# Patient Record
Sex: Female | Born: 1935 | Race: Black or African American | Hispanic: No | Marital: Single | Smoking: Current every day smoker
Health system: Southern US, Community
[De-identification: ages and names within clinical notes are randomized; demographics above are authoritative.]

## PROBLEM LIST (undated history)

## (undated) DIAGNOSIS — I10 Essential (primary) hypertension: Secondary | ICD-10-CM

## (undated) DIAGNOSIS — Z720 Tobacco use: Secondary | ICD-10-CM

## (undated) DIAGNOSIS — D696 Thrombocytopenia, unspecified: Secondary | ICD-10-CM

## (undated) DIAGNOSIS — E039 Hypothyroidism, unspecified: Secondary | ICD-10-CM

## (undated) DIAGNOSIS — A419 Sepsis, unspecified organism: Secondary | ICD-10-CM

---

## 2008-07-26 ENCOUNTER — Emergency Department: Payer: Self-pay | Admitting: Internal Medicine

## 2010-09-05 ENCOUNTER — Inpatient Hospital Stay: Payer: Self-pay | Admitting: Internal Medicine

## 2010-10-02 ENCOUNTER — Ambulatory Visit: Payer: Self-pay | Admitting: Internal Medicine

## 2011-10-09 ENCOUNTER — Other Ambulatory Visit: Payer: Self-pay

## 2011-10-09 ENCOUNTER — Emergency Department (HOSPITAL_COMMUNITY)
Admission: EM | Admit: 2011-10-09 | Discharge: 2011-10-09 | Disposition: A | Discharge status: Home / Self Care | Payer: Medicare Other | Attending: Emergency Medicine | Admitting: Emergency Medicine

## 2011-10-09 ENCOUNTER — Emergency Department (HOSPITAL_COMMUNITY): Payer: Medicare Other

## 2011-10-09 DIAGNOSIS — R29810 Facial weakness: Secondary | ICD-10-CM | POA: Insufficient documentation

## 2011-10-09 DIAGNOSIS — R0602 Shortness of breath: Secondary | ICD-10-CM | POA: Insufficient documentation

## 2011-10-09 DIAGNOSIS — R29898 Other symptoms and signs involving the musculoskeletal system: Secondary | ICD-10-CM | POA: Insufficient documentation

## 2011-10-09 DIAGNOSIS — J189 Pneumonia, unspecified organism: Secondary | ICD-10-CM | POA: Insufficient documentation

## 2011-10-09 DIAGNOSIS — Z79899 Other long term (current) drug therapy: Secondary | ICD-10-CM | POA: Insufficient documentation

## 2011-10-09 LAB — CBC
MCH: 31.2 pg (ref 26.0–34.0)
MCHC: 33.8 g/dL (ref 30.0–36.0)
MCV: 92.4 fL (ref 78.0–100.0)
Platelets: 104 10*3/uL — ABNORMAL LOW (ref 150–400)
RBC: 3.94 MIL/uL (ref 3.87–5.11)
RDW: 13.5 % (ref 11.5–15.5)

## 2011-10-09 LAB — URINALYSIS, ROUTINE W REFLEX MICROSCOPIC
Glucose, UA: NEGATIVE mg/dL
Ketones, ur: NEGATIVE mg/dL
Leukocytes, UA: NEGATIVE
Protein, ur: 30 mg/dL — AB
Urobilinogen, UA: 0.2 mg/dL (ref 0.0–1.0)

## 2011-10-09 LAB — COMPREHENSIVE METABOLIC PANEL
ALT: 11 U/L (ref 0–35)
AST: 22 U/L (ref 0–37)
Albumin: 2.8 g/dL — ABNORMAL LOW (ref 3.5–5.2)
Alkaline Phosphatase: 65 U/L (ref 39–117)
CO2: 27 mEq/L (ref 19–32)
Chloride: 95 mEq/L — ABNORMAL LOW (ref 96–112)
GFR calc non Af Amer: 87 mL/min — ABNORMAL LOW (ref >90)
Potassium: 3.5 mEq/L (ref 3.5–5.1)
Sodium: 131 mEq/L — ABNORMAL LOW (ref 135–145)
Total Bilirubin: 0.2 mg/dL — ABNORMAL LOW (ref 0.3–1.2)

## 2011-10-09 LAB — DIFFERENTIAL
Basophils Relative: 0 % (ref 0–1)
Eosinophils Absolute: 0 10*3/uL (ref 0.0–0.7)
Eosinophils Relative: 0 % (ref 0–5)
Lymphs Abs: 0.8 10*3/uL (ref 0.7–4.0)

## 2011-10-09 LAB — URINE MICROSCOPIC-ADD ON

## 2011-10-09 MED ORDER — AZITHROMYCIN 250 MG PO TABS: 250.0000 mg | ORAL_TABLET | Freq: Every day | ORAL | Status: AC

## 2011-10-09 MED ORDER — AZITHROMYCIN 250 MG PO TABS
500.0000 mg | ORAL_TABLET | Freq: Once | ORAL | Status: AC
  Administered 2011-10-09: 500 mg via ORAL
  Filled 2011-10-09: qty 2

## 2011-10-09 NOTE — ED Notes (Signed)
Patient states she has been feeling weak x 2 days and went to her doctors today. Doctor sent patient to ED for evaluation of a possible stroke. Patient states that her mouth drooping on one side due to not having her lower plate in her mouth. Patient denies N/V/F. Patietn states generalized weakness in her legs. Patient denies chest pain and SOB. Patient placed on monitor and 2L oxygen with sats of 93%. EKG captured on arrival.

## 2011-10-09 NOTE — ED Notes (Signed)
Patient resting and waiting test results. Family at bedside.

## 2011-10-09 NOTE — ED Notes (Signed)
Pt arrives via EMS from MD office where she was being seen for slight facial droop and slight left side weakness X2 days.  Pt able to ambulate as normal.  Per EMS EKG normal sinus  138/87   124 CBG, 10%nasal cannula 2 liters lung sounds clear.  No IV access, NKDA, history of MI, asthma.  No history of stroke.  Pt denies pain, N/V

## 2011-10-09 NOTE — ED Notes (Addendum)
Pt ambulated by Idalia Needle, EMT  without difficulty with cane. Pt has steady gait without any assistance needed.

## 2011-10-09 NOTE — ED Provider Notes (Signed)
History     CSN: [id number]  Arrival date & time 10/09/11  1400   First MD Initiated Contact with Patient 10/09/11 1411      Chief Complaint  Patient presents with  . Weakness    (Consider location/radiation/quality/duration/timing/severity/associated sxs/prior treatment) HPI Hx obtained from pt. Patient presents with 2 days of weakness. She states that she feels weak in her legs bilaterally. She is able to walk, but has been using a cane to do so. She states that she does not always need to use a cane. She has not noticed any weakness in her upper extremities or numbness or tingling. Denies trouble speaking. Denies changes in vision. Has not had any chest pain, shortness of breath, diaphoresis. Denies abdominal pain, nausea, vomiting, diarrhea. Denies cough or URI symptoms. She went to her PCPs office today and was noted to have some left facial drooping and was told to come to the ED for further evaluation. The patient states that she believes this is because she is not currently wearing her lower set of dentures.  No past medical history on file.  No past surgical history on file.  No family history on file.  History  Substance Use Topics  . Smoking status: Not on file  . Smokeless tobacco: Not on file  . Alcohol Use: Not on file    OB History    No data available      Review of Systems  Constitutional: Negative for fever, chills, activity change and appetite change.  HENT: Negative for congestion, sore throat, rhinorrhea, trouble swallowing and neck pain.   Eyes: Negative for photophobia and visual disturbance.  Respiratory: Negative for chest tightness and shortness of breath.   Cardiovascular: Negative for chest pain and palpitations.  Gastrointestinal: Negative for nausea, vomiting, abdominal pain and diarrhea.  Genitourinary: Negative for dysuria.  Musculoskeletal: Negative for myalgias.  Skin: Negative for color change and rash.  Neurological: Positive for  weakness. Negative for dizziness, syncope, numbness and headaches.    Allergies  Review of patient's allergies indicates no known allergies.  Home Medications   Current Outpatient Rx  Name Route Sig Dispense Refill  . GUAIFENESIN-CODEINE 100-10 MG/5ML PO SYRP Oral Take 10 mLs by mouth every 8 (eight) hours as needed. For cough    . LOSARTAN POTASSIUM-HCTZ 100-25 MG PO TABS Oral Take 1 tablet by mouth daily.    Marland Kitchen METOPROLOL SUCCINATE ER 25 MG PO TB24 Oral Take 25 mg by mouth daily.    Marland Kitchen POTASSIUM CHLORIDE ER 10 MEQ PO TBCR Oral Take 10 mEq by mouth 2 (two) times daily.    Marland Kitchen SIMVASTATIN 80 MG PO TABS Oral Take 80 mg by mouth at bedtime.      BP 109/80  Pulse 96  Temp(Src) 99.6 F (37.6 C) (Oral)  Resp 16  SpO2 93%  Physical Exam  Nursing note and vitals reviewed. Constitutional: She is oriented to person, place, and time. She appears well-developed and well-nourished. No distress.  HENT:  Head: Normocephalic and atraumatic.  Right Ear: External ear normal.  Left Ear: External ear normal.  Mouth/Throat: Oropharynx is clear and moist. No oropharyngeal exudate.  Eyes: Conjunctivae and EOM are normal. Pupils are equal, round, and reactive to light.  Neck: Normal range of motion. Neck supple.  Cardiovascular: Normal rate, regular rhythm and normal heart sounds.   Pulmonary/Chest: Effort normal and breath sounds normal. She has no wheezes. She exhibits no tenderness.  Abdominal: Soft. Bowel sounds are normal. There is no tenderness.  There is no rebound and no guarding.  Musculoskeletal: Normal range of motion. She exhibits no edema.  Neurological: She is alert and oriented to person, place, and time. She has normal reflexes. She displays normal reflexes. No cranial nerve deficit or sensory deficit. She exhibits normal muscle tone. Coordination normal.       5/5 strength to resistance on pedal/dorsiflex, knee flex/ext, elbow flex/ext. Grip strength equal b/l. Moves all 4 ext equally,  sensory intact to lt touch in all 4 ext. Coordination as assessed with finger to nose intact. No obvious facial droop noted.  Skin: Skin is warm and dry. No rash noted. She is not diaphoretic.  Psychiatric: She has a normal mood and affect.    ED Course  Procedures (including critical care time)   Date: 10/09/2011  Rate: 82  Rhythm: normal sinus rhythm and sinus arrhythmia  QRS Axis: left  Intervals: normal  ST/T Wave abnormalities: nonspecific T wave abnormality - flipped Ts in V4 and V3  Conduction Disutrbances:none  Narrative Interpretation:   Old EKG Reviewed: none available    Labs Reviewed  COMPREHENSIVE METABOLIC PANEL - Abnormal; Notable for the following:    Sodium 131 (*)    Chloride 95 (*)    Total Protein 8.5 (*)    Albumin 2.8 (*)    Total Bilirubin 0.2 (*)    GFR calc non Af Amer 87 (*)    All other components within normal limits  CBC - Abnormal; Notable for the following:    Platelets 104 (*) PLATELET COUNT CONFIRMED BY SMEAR   All other components within normal limits  DIFFERENTIAL - Abnormal; Notable for the following:    Neutrophils Relative 84 (*)    Neutro Abs 7.8 (*)    Lymphocytes Relative 8 (*)    All other components within normal limits  URINALYSIS, ROUTINE W REFLEX MICROSCOPIC - Abnormal; Notable for the following:    Protein, ur 30 (*)    All other components within normal limits  POCT I-STAT TROPONIN I  URINE MICROSCOPIC-ADD ON   Dg Chest 2 View  10/09/2011  *RADIOLOGY REPORT*  Clinical Data: Weakness, shortness of breatha  CHEST - 2 VIEW  Comparison: None.  Findings: There is patchy airspace opacity in the posterior left lower lobe and infrahilar region.  Right lung clear.  No effusion. Heart size upper limits normal.  Atheromatous aortic arch. Regional bones unremarkable.  IMPRESSION:  1.  Patchy posterior left lower lobe airspace disease suggesting pneumonia. Recommend follow-up to confirm appropriate resolution.  Original Report  Authenticated By: Osa Craver, M.D.   Ct Head Wo Contrast  10/09/2011  *RADIOLOGY REPORT*  Clinical Data: Weakness.  CT HEAD WITHOUT CONTRAST  Technique:  Contiguous axial images were obtained from the base of the skull through the vertex without contrast.  Comparison: No priors.  Findings: Mild cerebral and cerebellar atrophy with ex vacuo dilatation of the ventricular system.  There are patchy and confluent areas of decreased attenuation throughout the deep and periventricular white matter of the cerebral hemispheres bilaterally, most consistent with chronic microvascular ischemic changes.  No definite acute intracranial abnormalities. Specifically, no definite signs to suggest acute/subacute cerebral ischemia (assessment is very difficult upon this background of chronic ischemic changes, however), no focal mass, mass effect, hydrocephalus or abnormal intra or extra-axial fluid collections. No lacunar infarction in the left globus pallidus.  No displaced skull fractures are identified.  Visualized paranasal sinuses and mastoids are well pneumatized.  A small osteoma in the  right posterior ethmoids.  IMPRESSION: 1.  There are extensive chronic microvascular ischemic changes in the deep and periventricular white matter of the cerebral hemispheres bilaterally which makes accurate assessment for subtle areas of acute and subacute ischemia exceedingly difficult.  Within the limitations of this examination, there are no definite acute findings in the brain. 2.  Mild cerebral and cerebellar atrophy. 3.  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 4.  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  Original Report Authenticated By: Florencia Reasons, M.D.     1. Community acquired pneumonia       MDM  Pt with 2 days of weakness in her bilateral legs with walking. No cp, sob. No neuro deficits on exam. Labs/imaging which I personally reviewed significant for infiltrate suspicious for  early PNA on CXR. Otherwise remarkable for mild hyponatremia. UA without evidence for infx. Plan to give pt first dose abx, ambulate. Provided she can ambulate, will dc home with abx rx. Signout given to Dr. Golda Acre who will dispo as appropriate.       Grant Fontana, [place] 10/10/11 [phone]

## 2011-10-09 NOTE — ED Provider Notes (Signed)
Patient relates she started feeling weak 2 days ago. She states her left leg feels weak. She's having some difficulty walking. She denies any weakness in her hand. She denies cough, shortness of breath or fever.  Patient is alert and cooperative she's in no respiratory distress, I do not see any coughing. No obvious motor weakness noted  Medical screening examination/treatment/procedure(s) were conducted as a shared visit with non-physician practitioner(s) and myself.  I personally evaluated the patient during the encounter Devoria Albe, MD, Franz Dell, MD 10/09/11 1537

## 2011-10-09 NOTE — ED Provider Notes (Signed)
Patient turned over by the PA Grant Fontana for followup on the patient's ability to ambulate.  Patient did ambulate here with oxygen saturations of 95% and has received her initial dose of antibiotics.  Patient did have a possible infiltrate on her x-ray concerning for pneumonia which would explain this patient's generalized weakness.  Patient discharged with a prescription for azithromycin and followup with her primary care physician  Nat Christen, MD 10/09/11 [phone]

## 2011-10-09 NOTE — Discharge Instructions (Signed)

## 2011-10-09 NOTE — ED Notes (Signed)
Patient resting with NAD at this time. Family at bedside.  

## 2011-10-09 NOTE — ED Notes (Signed)
Patient transported to X-ray 

## 2011-10-14 NOTE — ED Provider Notes (Signed)
See prior note   Ward Givens, MD 10/14/11 1504

## 2012-05-22 ENCOUNTER — Encounter (HOSPITAL_COMMUNITY): Payer: Self-pay | Admitting: Emergency Medicine

## 2012-05-22 ENCOUNTER — Emergency Department (HOSPITAL_COMMUNITY)
Admission: EM | Admit: 2012-05-22 | Discharge: 2012-05-22 | Disposition: A | Discharge status: Home / Self Care | Payer: PRIVATE HEALTH INSURANCE | Attending: Emergency Medicine | Admitting: Emergency Medicine

## 2012-05-22 DIAGNOSIS — T7840XA Allergy, unspecified, initial encounter: Secondary | ICD-10-CM

## 2012-05-22 DIAGNOSIS — Z79899 Other long term (current) drug therapy: Secondary | ICD-10-CM | POA: Insufficient documentation

## 2012-05-22 DIAGNOSIS — L299 Pruritus, unspecified: Secondary | ICD-10-CM | POA: Insufficient documentation

## 2012-05-22 DIAGNOSIS — T4595XA Adverse effect of unspecified primarily systemic and hematological agent, initial encounter: Secondary | ICD-10-CM | POA: Insufficient documentation

## 2012-05-22 DIAGNOSIS — F17200 Nicotine dependence, unspecified, uncomplicated: Secondary | ICD-10-CM | POA: Insufficient documentation

## 2012-05-22 MED ORDER — PREDNISONE 20 MG PO TABS
60.0000 mg | ORAL_TABLET | Freq: Once | ORAL | Status: AC
  Administered 2012-05-22: 60 mg via ORAL
  Filled 2012-05-22: qty 3

## 2012-05-22 MED ORDER — DIPHENHYDRAMINE HCL 25 MG PO TABS: 25.0000 mg | ORAL_TABLET | Freq: Four times a day (QID) | ORAL | Status: DC | PRN

## 2012-05-22 MED ORDER — FAMOTIDINE 20 MG PO TABS: 20.0000 mg | ORAL_TABLET | Freq: Two times a day (BID) | ORAL | Status: DC

## 2012-05-22 MED ORDER — DIPHENHYDRAMINE HCL 25 MG PO CAPS
25.0000 mg | ORAL_CAPSULE | Freq: Once | ORAL | Status: AC
  Administered 2012-05-22: 25 mg via ORAL
  Filled 2012-05-22: qty 1

## 2012-05-22 MED ORDER — PREDNISONE 20 MG PO TABS: 40.0000 mg | ORAL_TABLET | Freq: Every day | ORAL | Status: DC

## 2012-05-22 NOTE — ED Notes (Signed)
Pt presenting to ed with c/o rash to bilateral arms and bilateral legs x 1 week. Pt states positive itching. Pt denies pain at this time

## 2012-05-22 NOTE — ED Provider Notes (Signed)
History     CSN: [id number]  Arrival date & time 05/22/12  1609   First MD Initiated Contact with Patient 05/22/12 1727      Chief Complaint  Patient presents with  . Rash    (Consider location/radiation/quality/duration/timing/severity/associated sxs/prior treatment) HPI Comments: Patient reports pruritic rash on all of her extremities that has been present for 1 week.  Denies fevers, itching or swelling in the mouth or throat, difficulty swallowing or breathing.  Denies any pain with the rash.  No family members or close contacts with similar rash.  Granddaughter dose note that patient has been taking dollar store brand cough medication this week.  No other medication changes.  No other exposures or changes to personal care products or detergents.   The history is provided by the patient and a relative.    History reviewed. No pertinent past medical history.  History reviewed. No pertinent past surgical history.  No family history on file.  History  Substance Use Topics  . Smoking status: Current Every Day Smoker -- 1.0 packs/day  . Smokeless tobacco: Not on file  . Alcohol Use: No    OB History    Grav Para Term Preterm Abortions TAB SAB Ect Mult Living                  Review of Systems  Constitutional: Negative for fever and chills.  HENT: Negative for sore throat and trouble swallowing.   Respiratory: Negative for shortness of breath.   Cardiovascular: Negative for chest pain.  Skin: Positive for rash.    Allergies  Review of patient's allergies indicates no known allergies.  Home Medications   Current Outpatient Rx  Name Route Sig Dispense Refill  . GUAIFENESIN-CODEINE 100-10 MG/5ML PO SYRP Oral Take 10 mLs by mouth every 8 (eight) hours as needed. For cough    . HYDROCORTISONE 0.5 % EX CREA Topical Apply 1 application topically 2 (two) times daily.    Marland Kitchen LOSARTAN POTASSIUM-HCTZ 100-25 MG PO TABS Oral Take 1 tablet by mouth daily.    Marland Kitchen METOPROLOL  SUCCINATE ER 25 MG PO TB24 Oral Take 25 mg by mouth every other day.     Marland Kitchen SIMVASTATIN 80 MG PO TABS Oral Take 80 mg by mouth at bedtime.      BP 111/74  Pulse 109  Temp 98.8 F (37.1 C) (Oral)  Resp 20  SpO2 98%  Physical Exam  Nursing note and vitals reviewed. Constitutional: She appears well-developed and well-nourished. No distress.  HENT:  Head: Normocephalic and atraumatic.  Neck: Neck supple.  Cardiovascular: Normal rate, regular rhythm and normal heart sounds.   Pulmonary/Chest: Effort normal and breath sounds normal. No respiratory distress. She has no wheezes. She has no rales.  Neurological: She is alert.  Skin: Rash noted. She is not diaphoretic.       Erythematous continuous rash along all extremities.  Spares trunk, face feet, and hands.     ED Course  Procedures (including critical care time)  Labs Reviewed - No data to display No results found.  6:17 PM Pt also seen and examined by Dr Freida Busman.    1. Allergic reaction     MDM  Pt with diffuse pruritic rash on extremities only, sparing hands and feet.  No fevers, no pain.  No airway concerns.  Pt has been using OTC cough medication from dollar store - likely source of reaction.  Pt advised never to use this again.  Pt given prednisone, benadryl in  ED, D/C home with same + pepcid.  Discussed diagnosis and plan with patient and family member.  Pt given return precautions.  Pt verbalizes understanding and agrees with plan.          [place], [place] 05/22/12 1901

## 2012-05-22 NOTE — ED Provider Notes (Signed)
Medical screening examination/treatment/procedure(s) were performed by non-physician practitioner and as supervising physician I was immediately available for consultation/collaboration.  Toy Baker, MD 05/22/12 [phone]

## 2012-05-22 NOTE — ED Provider Notes (Signed)
Medical screening examination/treatment/procedure(s) were conducted as a shared visit with non-physician practitioner(s) and myself.  I personally evaluated the patient during the encounter  Pt here with allergic reaction from meds, will tx with meds  Toy Baker, MD 05/22/12 1814

## 2014-08-01 DIAGNOSIS — H2512 Age-related nuclear cataract, left eye: Secondary | ICD-10-CM | POA: Diagnosis not present

## 2014-08-01 DIAGNOSIS — H5440 Blindness, one eye, unspecified eye: Secondary | ICD-10-CM | POA: Diagnosis not present

## 2014-08-01 DIAGNOSIS — H538 Other visual disturbances: Secondary | ICD-10-CM | POA: Diagnosis not present

## 2014-08-11 DIAGNOSIS — E785 Hyperlipidemia, unspecified: Secondary | ICD-10-CM | POA: Diagnosis not present

## 2014-08-11 DIAGNOSIS — Z01118 Encounter for examination of ears and hearing with other abnormal findings: Secondary | ICD-10-CM | POA: Diagnosis not present

## 2014-08-11 DIAGNOSIS — R7309 Other abnormal glucose: Secondary | ICD-10-CM | POA: Diagnosis not present

## 2014-08-11 DIAGNOSIS — Z0101 Encounter for examination of eyes and vision with abnormal findings: Secondary | ICD-10-CM | POA: Diagnosis not present

## 2014-08-11 DIAGNOSIS — Z0000 Encounter for general adult medical examination without abnormal findings: Secondary | ICD-10-CM | POA: Diagnosis not present

## 2014-08-11 DIAGNOSIS — Z1389 Encounter for screening for other disorder: Secondary | ICD-10-CM | POA: Diagnosis not present

## 2014-08-15 DIAGNOSIS — B309 Viral conjunctivitis, unspecified: Secondary | ICD-10-CM | POA: Diagnosis not present

## 2014-08-15 DIAGNOSIS — H2512 Age-related nuclear cataract, left eye: Secondary | ICD-10-CM | POA: Diagnosis not present

## 2014-08-15 DIAGNOSIS — H5440 Blindness, one eye, unspecified eye: Secondary | ICD-10-CM | POA: Diagnosis not present

## 2014-08-15 DIAGNOSIS — H538 Other visual disturbances: Secondary | ICD-10-CM | POA: Diagnosis not present

## 2014-09-13 DIAGNOSIS — H25812 Combined forms of age-related cataract, left eye: Secondary | ICD-10-CM | POA: Diagnosis not present

## 2014-09-13 DIAGNOSIS — H40031 Anatomical narrow angle, right eye: Secondary | ICD-10-CM | POA: Diagnosis not present

## 2014-09-13 DIAGNOSIS — H2521 Age-related cataract, morgagnian type, right eye: Secondary | ICD-10-CM | POA: Diagnosis not present

## 2014-09-13 DIAGNOSIS — H20011 Primary iridocyclitis, right eye: Secondary | ICD-10-CM | POA: Diagnosis not present

## 2014-09-19 DIAGNOSIS — H25811 Combined forms of age-related cataract, right eye: Secondary | ICD-10-CM | POA: Diagnosis not present

## 2014-09-19 DIAGNOSIS — H2511 Age-related nuclear cataract, right eye: Secondary | ICD-10-CM | POA: Diagnosis not present

## 2014-09-24 ENCOUNTER — Encounter (HOSPITAL_COMMUNITY): Payer: Self-pay | Admitting: *Deleted

## 2014-09-24 ENCOUNTER — Emergency Department (HOSPITAL_COMMUNITY)
Admission: EM | Admit: 2014-09-24 | Discharge: 2014-09-24 | Disposition: A | Discharge status: Home / Self Care | Payer: Medicare Other | Attending: Emergency Medicine | Admitting: Emergency Medicine

## 2014-09-24 DIAGNOSIS — Z720 Tobacco use: Secondary | ICD-10-CM | POA: Diagnosis not present

## 2014-09-24 DIAGNOSIS — R29898 Other symptoms and signs involving the musculoskeletal system: Secondary | ICD-10-CM

## 2014-09-24 DIAGNOSIS — R000 Tachycardia, unspecified: Secondary | ICD-10-CM | POA: Diagnosis not present

## 2014-09-24 DIAGNOSIS — I10 Essential (primary) hypertension: Secondary | ICD-10-CM | POA: Insufficient documentation

## 2014-09-24 DIAGNOSIS — R531 Weakness: Secondary | ICD-10-CM | POA: Diagnosis present

## 2014-09-24 DIAGNOSIS — M6281 Muscle weakness (generalized): Secondary | ICD-10-CM | POA: Diagnosis not present

## 2014-09-24 HISTORY — DX: Essential (primary) hypertension: I10

## 2014-09-24 LAB — I-STAT TROPONIN, ED: TROPONIN I, POC: 0.02 ng/mL (ref 0.00–0.08)

## 2014-09-24 LAB — BASIC METABOLIC PANEL
ANION GAP: 6 (ref 5–15)
BUN: 13 mg/dL (ref 6–23)
CALCIUM: 9.2 mg/dL (ref 8.4–10.5)
CHLORIDE: 107 mmol/L (ref 96–112)
CO2: 22 mmol/L (ref 19–32)
Creatinine, Ser: 0.96 mg/dL (ref 0.50–1.10)
GFR calc non Af Amer: 55 mL/min — ABNORMAL LOW (ref >90)
GFR, EST AFRICAN AMERICAN: 64 mL/min — AB (ref >90)
GLUCOSE: 112 mg/dL — AB (ref 70–99)
POTASSIUM: 3.5 mmol/L (ref 3.5–5.1)
Sodium: 135 mmol/L (ref 135–145)

## 2014-09-24 LAB — CBC
HEMATOCRIT: 41.7 % (ref 36.0–46.0)
HEMOGLOBIN: 13.6 g/dL (ref 12.0–15.0)
MCH: 30.5 pg (ref 26.0–34.0)
MCHC: 32.6 g/dL (ref 30.0–36.0)
MCV: 93.5 fL (ref 78.0–100.0)
Platelets: 151 10*3/uL (ref 150–400)
RBC: 4.46 MIL/uL (ref 3.87–5.11)
RDW: 14.1 % (ref 11.5–15.5)
WBC: 7.6 10*3/uL (ref 4.0–10.5)

## 2014-09-24 NOTE — ED Notes (Signed)
Pt reports fatigue and weakness since she got up this am. "feels like her legs are giving out on her." grips are equal and no arm drift noted. Family denies speech problems or confusion.

## 2014-09-24 NOTE — ED Provider Notes (Addendum)
CSN: [id number]     Arrival date & time 09/24/14  1239 History   First MD Initiated Contact with Patient 09/24/14 1458     Chief Complaint  Patient presents with  . Weakness     (Consider location/radiation/quality/duration/timing/severity/associated sxs/prior Treatment) HPI  81 year old female presents with weakness and trouble getting off the toilet that happened around noon today. The son states that while she was cutting off the toilet she was unable to get the strength to get off. This was from weakness in both legs. There was no arm weakness. Patient has mild memory problems but otherwise has not been confused and is not different than her normal mental status. Patient has not had any headaches, vomiting, or diarrhea. She denies chest pain and shortness of breath. Currently she feels her strength in her legs is better she has no trouble moving her legs. Denies any back pain or back injury. No urinary symptoms including no urinary or bowel incontinence. She currently lives at home with her sister.  Past Medical History  Diagnosis Date  . Hypertension    History reviewed. No pertinent past surgical history. History reviewed. No pertinent family history. History  Substance Use Topics  . Smoking status: Current Every Day Smoker    Types: Cigarettes  . Smokeless tobacco: Not on file  . Alcohol Use: No   OB History    No data available     Review of Systems  Constitutional: Negative for fever.  Respiratory: Negative for cough and shortness of breath.   Cardiovascular: Negative for chest pain.  Gastrointestinal: Negative for nausea, vomiting, abdominal pain and diarrhea.  Genitourinary: Negative for dysuria.  Neurological: Positive for weakness. Negative for dizziness, light-headedness, numbness and headaches.  All other systems reviewed and are negative.     Allergies  Review of patient's allergies indicates no known allergies.  Home Medications   Prior to Admission  medications   Not on File   BP 121/76 mmHg  Pulse 80  Temp(Src) 98.2 F (36.8 C)  Resp 16  SpO2 100% Physical Exam  Constitutional: She is oriented to person, place, and time. She appears well-developed and well-nourished.  HENT:  Head: Normocephalic and atraumatic.  Right Ear: External ear normal.  Left Ear: External ear normal.  Nose: Nose normal.  Eyes: EOM are normal. Pupils are equal, round, and reactive to light. Right eye exhibits no discharge. Left eye exhibits no discharge.  Right eye covered by clear shield  Cardiovascular: Normal rate, regular rhythm and normal heart sounds.   Pulmonary/Chest: Effort normal and breath sounds normal.  Abdominal: Soft. She exhibits no distension. There is no tenderness.  Musculoskeletal:       Cervical back: She exhibits no tenderness.       Thoracic back: She exhibits no tenderness.       Lumbar back: She exhibits no tenderness.  Neurological: She is alert and oriented to person, place, and time. She has normal reflexes.  CN 2-12 grossly intact. 5/5 strength in all 4 extremities. Normal sensation. Able to get up and walk. Walks with a short, shuffling gait, normal per son.  Skin: Skin is warm and dry.  Nursing note and vitals reviewed.   ED Course  Procedures (including critical care time) Labs Review Labs Reviewed  BASIC METABOLIC PANEL - Abnormal; Notable for the following:    Glucose, Bld 112 (*)    GFR calc non Af Amer 55 (*)    GFR calc Af Amer 64 (*)  All other components within normal limits  CBC  I-STAT TROPOININ, ED    Imaging Review No results found.   EKG Interpretation   Date/Time:  Saturday September 24 2014 13:12:43 EST Ventricular Rate:  102 PR Interval:  148 QRS Duration: 72 QT Interval:  348 QTC Calculation: 453 R Axis:   90 Text Interpretation:  Sinus tachycardia with occasional Premature  ventricular complexes Rightward axis Low voltage QRS Borderline ECG  nonspecific T waves No old tracing to  compare Confirmed by Doss Cybulski  MD,  Trianna Lupien ([id number]) on 09/24/2014 3:00:49 PM      MDM   Final diagnoses:  Weakness of both legs    Here patient has normal neurologic exam including being able to get up and walk with minimal to no axis distance. This is normal for her. It seems that she had changing weakness try to get off of the toilet. Difficult to explain what this is from given that she has normal proximal strength in her quadriceps on my exam. Normal sensation and reflexes. No back symptoms to suggest a spinal emergency. Given she has no symptoms now feel she is stable for discharge. I did discuss with the case management who has evaluated patient and will arrange for home health and physical therapy to evaluate patient at home. She has been laying around a lot and not been active per her son, so conditioning is likely a factor in her weakness. Given bilateral symptoms this is unlikely a TIA or CVA    Audree CamelScott T Saurabh Hettich, MD 09/24/14 1616  Audree CamelScott T Chayna Surratt, MD 09/24/14 [phone]

## 2014-09-24 NOTE — Progress Notes (Signed)
CSW met with patient and her son at their request to receive information about assisted livings.  CSW discussed the options of SNF, ALF, and home health and gave resources.  Patient and son stated they would like to try receiving a home health/ PT assessment to see if she could remain in home and, "get stronger."  After given the options the family seemed visibly less anxious and asked if a referral could be made to an agency that would take her agency.  Patient's son asked that the agency contact him, Taylor Bass at [phone].  [facility] Richardo Priest ED CSW [phone]

## 2014-09-24 NOTE — Progress Notes (Signed)
09/24/2014 3:48 PM Taylor Bass, Justice DeedsJUDITH SPARKS, RN,BSN        [facility] son [facility]. Orders noted for home health OT and PT to eval and treat. [facility]. [facility] [phone]. No further needs identified at this time.

## 2014-09-26 DIAGNOSIS — R531 Weakness: Secondary | ICD-10-CM | POA: Diagnosis not present

## 2014-09-26 DIAGNOSIS — I10 Essential (primary) hypertension: Secondary | ICD-10-CM | POA: Diagnosis not present

## 2014-09-26 DIAGNOSIS — F17210 Nicotine dependence, cigarettes, uncomplicated: Secondary | ICD-10-CM | POA: Diagnosis not present

## 2014-09-27 DIAGNOSIS — H2512 Age-related nuclear cataract, left eye: Secondary | ICD-10-CM | POA: Diagnosis not present

## 2014-09-28 DIAGNOSIS — I10 Essential (primary) hypertension: Secondary | ICD-10-CM | POA: Diagnosis not present

## 2014-09-28 DIAGNOSIS — F17210 Nicotine dependence, cigarettes, uncomplicated: Secondary | ICD-10-CM | POA: Diagnosis not present

## 2014-09-28 DIAGNOSIS — R531 Weakness: Secondary | ICD-10-CM | POA: Diagnosis not present

## 2014-10-03 DIAGNOSIS — I10 Essential (primary) hypertension: Secondary | ICD-10-CM | POA: Diagnosis not present

## 2014-10-03 DIAGNOSIS — H21562 Pupillary abnormality, left eye: Secondary | ICD-10-CM | POA: Diagnosis not present

## 2014-10-03 DIAGNOSIS — H2512 Age-related nuclear cataract, left eye: Secondary | ICD-10-CM | POA: Diagnosis not present

## 2014-10-03 DIAGNOSIS — H268 Other specified cataract: Secondary | ICD-10-CM | POA: Diagnosis not present

## 2014-10-03 DIAGNOSIS — R531 Weakness: Secondary | ICD-10-CM | POA: Diagnosis not present

## 2014-10-03 DIAGNOSIS — F17210 Nicotine dependence, cigarettes, uncomplicated: Secondary | ICD-10-CM | POA: Diagnosis not present

## 2014-10-05 DIAGNOSIS — I10 Essential (primary) hypertension: Secondary | ICD-10-CM | POA: Diagnosis not present

## 2014-10-05 DIAGNOSIS — F17210 Nicotine dependence, cigarettes, uncomplicated: Secondary | ICD-10-CM | POA: Diagnosis not present

## 2014-10-05 DIAGNOSIS — R531 Weakness: Secondary | ICD-10-CM | POA: Diagnosis not present

## 2014-10-11 DIAGNOSIS — R531 Weakness: Secondary | ICD-10-CM | POA: Diagnosis not present

## 2014-10-11 DIAGNOSIS — I10 Essential (primary) hypertension: Secondary | ICD-10-CM | POA: Diagnosis not present

## 2014-10-11 DIAGNOSIS — F17210 Nicotine dependence, cigarettes, uncomplicated: Secondary | ICD-10-CM | POA: Diagnosis not present

## 2014-10-12 DIAGNOSIS — E876 Hypokalemia: Secondary | ICD-10-CM | POA: Diagnosis not present

## 2014-10-12 DIAGNOSIS — E559 Vitamin D deficiency, unspecified: Secondary | ICD-10-CM | POA: Diagnosis not present

## 2014-10-12 DIAGNOSIS — E039 Hypothyroidism, unspecified: Secondary | ICD-10-CM | POA: Diagnosis not present

## 2014-10-12 DIAGNOSIS — I10 Essential (primary) hypertension: Secondary | ICD-10-CM | POA: Diagnosis not present

## 2014-10-12 DIAGNOSIS — R7309 Other abnormal glucose: Secondary | ICD-10-CM | POA: Diagnosis not present

## 2014-10-12 DIAGNOSIS — J45909 Unspecified asthma, uncomplicated: Secondary | ICD-10-CM | POA: Diagnosis not present

## 2014-10-12 DIAGNOSIS — E785 Hyperlipidemia, unspecified: Secondary | ICD-10-CM | POA: Diagnosis not present

## 2014-10-13 DIAGNOSIS — F17210 Nicotine dependence, cigarettes, uncomplicated: Secondary | ICD-10-CM | POA: Diagnosis not present

## 2014-10-13 DIAGNOSIS — R531 Weakness: Secondary | ICD-10-CM | POA: Diagnosis not present

## 2014-10-13 DIAGNOSIS — I10 Essential (primary) hypertension: Secondary | ICD-10-CM | POA: Diagnosis not present

## 2014-10-18 DIAGNOSIS — F17210 Nicotine dependence, cigarettes, uncomplicated: Secondary | ICD-10-CM | POA: Diagnosis not present

## 2014-10-18 DIAGNOSIS — I10 Essential (primary) hypertension: Secondary | ICD-10-CM | POA: Diagnosis not present

## 2014-10-18 DIAGNOSIS — R531 Weakness: Secondary | ICD-10-CM | POA: Diagnosis not present

## 2014-10-20 DIAGNOSIS — R531 Weakness: Secondary | ICD-10-CM | POA: Diagnosis not present

## 2014-10-20 DIAGNOSIS — F17210 Nicotine dependence, cigarettes, uncomplicated: Secondary | ICD-10-CM | POA: Diagnosis not present

## 2014-10-20 DIAGNOSIS — I10 Essential (primary) hypertension: Secondary | ICD-10-CM | POA: Diagnosis not present

## 2014-10-25 DIAGNOSIS — I10 Essential (primary) hypertension: Secondary | ICD-10-CM | POA: Diagnosis not present

## 2014-10-25 DIAGNOSIS — R531 Weakness: Secondary | ICD-10-CM | POA: Diagnosis not present

## 2014-10-25 DIAGNOSIS — F17210 Nicotine dependence, cigarettes, uncomplicated: Secondary | ICD-10-CM | POA: Diagnosis not present

## 2014-10-27 DIAGNOSIS — I739 Peripheral vascular disease, unspecified: Secondary | ICD-10-CM | POA: Diagnosis not present

## 2014-10-27 DIAGNOSIS — E559 Vitamin D deficiency, unspecified: Secondary | ICD-10-CM | POA: Diagnosis not present

## 2014-10-27 DIAGNOSIS — R7309 Other abnormal glucose: Secondary | ICD-10-CM | POA: Diagnosis not present

## 2014-11-07 ENCOUNTER — Encounter (HOSPITAL_COMMUNITY): Payer: Self-pay | Admitting: Emergency Medicine

## 2014-12-08 DIAGNOSIS — E785 Hyperlipidemia, unspecified: Secondary | ICD-10-CM | POA: Diagnosis not present

## 2014-12-08 DIAGNOSIS — I739 Peripheral vascular disease, unspecified: Secondary | ICD-10-CM | POA: Diagnosis not present

## 2014-12-08 DIAGNOSIS — I10 Essential (primary) hypertension: Secondary | ICD-10-CM | POA: Diagnosis not present

## 2014-12-08 DIAGNOSIS — E559 Vitamin D deficiency, unspecified: Secondary | ICD-10-CM | POA: Diagnosis not present

## 2014-12-08 DIAGNOSIS — R7309 Other abnormal glucose: Secondary | ICD-10-CM | POA: Diagnosis not present

## 2015-01-24 DIAGNOSIS — H4720 Unspecified optic atrophy: Secondary | ICD-10-CM | POA: Diagnosis not present

## 2015-01-24 DIAGNOSIS — H538 Other visual disturbances: Secondary | ICD-10-CM | POA: Diagnosis not present

## 2015-01-24 DIAGNOSIS — Z961 Presence of intraocular lens: Secondary | ICD-10-CM | POA: Diagnosis not present

## 2015-02-07 DIAGNOSIS — H524 Presbyopia: Secondary | ICD-10-CM | POA: Diagnosis not present

## 2015-02-07 DIAGNOSIS — Z961 Presence of intraocular lens: Secondary | ICD-10-CM | POA: Diagnosis not present

## 2015-04-04 DIAGNOSIS — E785 Hyperlipidemia, unspecified: Secondary | ICD-10-CM | POA: Diagnosis not present

## 2015-04-04 DIAGNOSIS — R7309 Other abnormal glucose: Secondary | ICD-10-CM | POA: Diagnosis not present

## 2015-04-04 DIAGNOSIS — E559 Vitamin D deficiency, unspecified: Secondary | ICD-10-CM | POA: Diagnosis not present

## 2015-04-04 DIAGNOSIS — I739 Peripheral vascular disease, unspecified: Secondary | ICD-10-CM | POA: Diagnosis not present

## 2015-04-04 DIAGNOSIS — I10 Essential (primary) hypertension: Secondary | ICD-10-CM | POA: Diagnosis not present

## 2015-08-17 DIAGNOSIS — Z01118 Encounter for examination of ears and hearing with other abnormal findings: Secondary | ICD-10-CM | POA: Diagnosis not present

## 2015-08-17 DIAGNOSIS — Z1389 Encounter for screening for other disorder: Secondary | ICD-10-CM | POA: Diagnosis not present

## 2015-08-17 DIAGNOSIS — I739 Peripheral vascular disease, unspecified: Secondary | ICD-10-CM | POA: Diagnosis not present

## 2015-08-17 DIAGNOSIS — Z131 Encounter for screening for diabetes mellitus: Secondary | ICD-10-CM | POA: Diagnosis not present

## 2015-08-17 DIAGNOSIS — J45909 Unspecified asthma, uncomplicated: Secondary | ICD-10-CM | POA: Diagnosis not present

## 2015-08-17 DIAGNOSIS — Z136 Encounter for screening for cardiovascular disorders: Secondary | ICD-10-CM | POA: Diagnosis not present

## 2015-08-17 DIAGNOSIS — Z0100 Encounter for examination of eyes and vision without abnormal findings: Secondary | ICD-10-CM | POA: Diagnosis not present

## 2015-08-17 DIAGNOSIS — Z0000 Encounter for general adult medical examination without abnormal findings: Secondary | ICD-10-CM | POA: Diagnosis not present

## 2015-10-26 DIAGNOSIS — Q809 Congenital ichthyosis, unspecified: Secondary | ICD-10-CM | POA: Diagnosis not present

## 2015-10-26 DIAGNOSIS — I10 Essential (primary) hypertension: Secondary | ICD-10-CM | POA: Diagnosis not present

## 2015-10-26 DIAGNOSIS — J45909 Unspecified asthma, uncomplicated: Secondary | ICD-10-CM | POA: Diagnosis not present

## 2015-10-26 DIAGNOSIS — I739 Peripheral vascular disease, unspecified: Secondary | ICD-10-CM | POA: Diagnosis not present

## 2015-10-26 DIAGNOSIS — R739 Hyperglycemia, unspecified: Secondary | ICD-10-CM | POA: Diagnosis not present

## 2015-10-26 DIAGNOSIS — E559 Vitamin D deficiency, unspecified: Secondary | ICD-10-CM | POA: Diagnosis not present

## 2015-10-26 DIAGNOSIS — G4700 Insomnia, unspecified: Secondary | ICD-10-CM | POA: Diagnosis not present

## 2015-10-26 DIAGNOSIS — E039 Hypothyroidism, unspecified: Secondary | ICD-10-CM | POA: Diagnosis not present

## 2015-11-23 DIAGNOSIS — Z0000 Encounter for general adult medical examination without abnormal findings: Secondary | ICD-10-CM | POA: Diagnosis not present

## 2015-11-23 DIAGNOSIS — E039 Hypothyroidism, unspecified: Secondary | ICD-10-CM | POA: Diagnosis not present

## 2015-11-23 DIAGNOSIS — Q809 Congenital ichthyosis, unspecified: Secondary | ICD-10-CM | POA: Diagnosis not present

## 2015-11-23 DIAGNOSIS — G4700 Insomnia, unspecified: Secondary | ICD-10-CM | POA: Diagnosis not present

## 2015-11-23 DIAGNOSIS — I10 Essential (primary) hypertension: Secondary | ICD-10-CM | POA: Diagnosis not present

## 2016-01-18 DIAGNOSIS — Z720 Tobacco use: Secondary | ICD-10-CM | POA: Diagnosis not present

## 2016-01-18 DIAGNOSIS — G4700 Insomnia, unspecified: Secondary | ICD-10-CM | POA: Diagnosis not present

## 2016-01-18 DIAGNOSIS — E039 Hypothyroidism, unspecified: Secondary | ICD-10-CM | POA: Diagnosis not present

## 2016-01-18 DIAGNOSIS — E785 Hyperlipidemia, unspecified: Secondary | ICD-10-CM | POA: Diagnosis not present

## 2016-01-18 DIAGNOSIS — I10 Essential (primary) hypertension: Secondary | ICD-10-CM | POA: Diagnosis not present

## 2016-02-15 ENCOUNTER — Telehealth: Payer: Self-pay | Admitting: Hematology

## 2016-02-15 NOTE — Telephone Encounter (Signed)
Called referring provider requesting notes and labs

## 2016-02-18 ENCOUNTER — Emergency Department (HOSPITAL_COMMUNITY): Payer: Medicare Other

## 2016-02-18 ENCOUNTER — Inpatient Hospital Stay (HOSPITAL_COMMUNITY)
Admission: EM | Admit: 2016-02-18 | Discharge: 2016-02-24 | DRG: 872 | Disposition: A | Discharge status: Home Health | Payer: Medicare Other | Attending: Internal Medicine | Admitting: Internal Medicine

## 2016-02-18 ENCOUNTER — Inpatient Hospital Stay (HOSPITAL_COMMUNITY): Payer: Medicare Other

## 2016-02-18 ENCOUNTER — Encounter (HOSPITAL_COMMUNITY): Payer: Self-pay

## 2016-02-18 DIAGNOSIS — R197 Diarrhea, unspecified: Secondary | ICD-10-CM | POA: Diagnosis not present

## 2016-02-18 DIAGNOSIS — R935 Abnormal findings on diagnostic imaging of other abdominal regions, including retroperitoneum: Secondary | ICD-10-CM | POA: Diagnosis not present

## 2016-02-18 DIAGNOSIS — Z720 Tobacco use: Secondary | ICD-10-CM | POA: Diagnosis present

## 2016-02-18 DIAGNOSIS — Z681 Body mass index (BMI) 19 or less, adult: Secondary | ICD-10-CM

## 2016-02-18 DIAGNOSIS — R740 Nonspecific elevation of levels of transaminase and lactic acid dehydrogenase [LDH]: Secondary | ICD-10-CM

## 2016-02-18 DIAGNOSIS — A4159 Other Gram-negative sepsis: Secondary | ICD-10-CM | POA: Diagnosis not present

## 2016-02-18 DIAGNOSIS — Z01818 Encounter for other preprocedural examination: Secondary | ICD-10-CM | POA: Diagnosis not present

## 2016-02-18 DIAGNOSIS — R945 Abnormal results of liver function studies: Secondary | ICD-10-CM

## 2016-02-18 DIAGNOSIS — R509 Fever, unspecified: Secondary | ICD-10-CM

## 2016-02-18 DIAGNOSIS — R7401 Elevation of levels of liver transaminase levels: Secondary | ICD-10-CM | POA: Diagnosis present

## 2016-02-18 DIAGNOSIS — K831 Obstruction of bile duct: Secondary | ICD-10-CM | POA: Diagnosis not present

## 2016-02-18 DIAGNOSIS — F17210 Nicotine dependence, cigarettes, uncomplicated: Secondary | ICD-10-CM | POA: Diagnosis present

## 2016-02-18 DIAGNOSIS — E46 Unspecified protein-calorie malnutrition: Secondary | ICD-10-CM | POA: Diagnosis present

## 2016-02-18 DIAGNOSIS — R7989 Other specified abnormal findings of blood chemistry: Secondary | ICD-10-CM | POA: Diagnosis not present

## 2016-02-18 DIAGNOSIS — K8064 Calculus of gallbladder and bile duct with chronic cholecystitis without obstruction: Secondary | ICD-10-CM | POA: Diagnosis present

## 2016-02-18 DIAGNOSIS — E872 Acidosis, unspecified: Secondary | ICD-10-CM | POA: Diagnosis present

## 2016-02-18 DIAGNOSIS — K8050 Calculus of bile duct without cholangitis or cholecystitis without obstruction: Secondary | ICD-10-CM

## 2016-02-18 DIAGNOSIS — R6520 Severe sepsis without septic shock: Secondary | ICD-10-CM

## 2016-02-18 DIAGNOSIS — D696 Thrombocytopenia, unspecified: Secondary | ICD-10-CM | POA: Diagnosis present

## 2016-02-18 DIAGNOSIS — K838 Other specified diseases of biliary tract: Secondary | ICD-10-CM | POA: Diagnosis present

## 2016-02-18 DIAGNOSIS — R7881 Bacteremia: Secondary | ICD-10-CM | POA: Diagnosis not present

## 2016-02-18 DIAGNOSIS — A419 Sepsis, unspecified organism: Secondary | ICD-10-CM | POA: Diagnosis present

## 2016-02-18 DIAGNOSIS — I10 Essential (primary) hypertension: Secondary | ICD-10-CM

## 2016-02-18 DIAGNOSIS — I959 Hypotension, unspecified: Secondary | ICD-10-CM | POA: Diagnosis present

## 2016-02-18 DIAGNOSIS — E039 Hypothyroidism, unspecified: Secondary | ICD-10-CM | POA: Diagnosis not present

## 2016-02-18 DIAGNOSIS — K810 Acute cholecystitis: Secondary | ICD-10-CM | POA: Diagnosis not present

## 2016-02-18 DIAGNOSIS — R6510 Systemic inflammatory response syndrome (SIRS) of non-infectious origin without acute organ dysfunction: Secondary | ICD-10-CM

## 2016-02-18 DIAGNOSIS — K760 Fatty (change of) liver, not elsewhere classified: Secondary | ICD-10-CM | POA: Diagnosis not present

## 2016-02-18 DIAGNOSIS — A4151 Sepsis due to Escherichia coli [E. coli]: Secondary | ICD-10-CM | POA: Diagnosis not present

## 2016-02-18 HISTORY — DX: Sepsis, unspecified organism: A41.9

## 2016-02-18 HISTORY — DX: Tobacco use: Z72.0

## 2016-02-18 HISTORY — DX: Hypothyroidism, unspecified: E03.9

## 2016-02-18 HISTORY — DX: Thrombocytopenia, unspecified: D69.6

## 2016-02-18 LAB — URINALYSIS, ROUTINE W REFLEX MICROSCOPIC
BILIRUBIN URINE: NEGATIVE
GLUCOSE, UA: NEGATIVE mg/dL
Hgb urine dipstick: NEGATIVE
KETONES UR: NEGATIVE mg/dL
Leukocytes, UA: NEGATIVE
NITRITE: NEGATIVE
PH: 7.5 (ref 5.0–8.0)
Protein, ur: NEGATIVE mg/dL
Specific Gravity, Urine: 1.015 (ref 1.005–1.030)

## 2016-02-18 LAB — TSH: TSH: 0.676 u[IU]/mL (ref 0.350–4.500)

## 2016-02-18 LAB — COMPREHENSIVE METABOLIC PANEL
ALBUMIN: 3.2 g/dL — AB (ref 3.5–5.0)
ALK PHOS: 126 U/L (ref 38–126)
ALT: 128 U/L — AB (ref 14–54)
AST: 232 U/L — ABNORMAL HIGH (ref 15–41)
Anion gap: 8 (ref 5–15)
BUN: 21 mg/dL — ABNORMAL HIGH (ref 6–20)
CHLORIDE: 104 mmol/L (ref 101–111)
CO2: 25 mmol/L (ref 22–32)
CREATININE: 0.96 mg/dL (ref 0.44–1.00)
Calcium: 9.2 mg/dL (ref 8.9–10.3)
GFR calc non Af Amer: 55 mL/min — ABNORMAL LOW (ref >60)
GLUCOSE: 108 mg/dL — AB (ref 65–99)
Potassium: 3.9 mmol/L (ref 3.5–5.1)
SODIUM: 137 mmol/L (ref 135–145)
Total Bilirubin: 0.6 mg/dL (ref 0.3–1.2)
Total Protein: 8.3 g/dL — ABNORMAL HIGH (ref 6.5–8.1)

## 2016-02-18 LAB — CBC WITH DIFFERENTIAL/PLATELET
BASOS ABS: 0 10*3/uL (ref 0.0–0.1)
Basophils Relative: 0 %
Eosinophils Absolute: 0.1 10*3/uL (ref 0.0–0.7)
Eosinophils Relative: 1 %
HEMATOCRIT: 39 % (ref 36.0–46.0)
Hemoglobin: 12.6 g/dL (ref 12.0–15.0)
LYMPHS ABS: 1.3 10*3/uL (ref 0.7–4.0)
LYMPHS PCT: 14 %
MCH: 30.9 pg (ref 26.0–34.0)
MCHC: 32.3 g/dL (ref 30.0–36.0)
MCV: 95.6 fL (ref 78.0–100.0)
MONO ABS: 0.3 10*3/uL (ref 0.1–1.0)
Monocytes Relative: 3 %
NEUTROS ABS: 7.5 10*3/uL (ref 1.7–7.7)
Neutrophils Relative %: 82 %
Platelets: 108 10*3/uL — ABNORMAL LOW (ref 150–400)
RBC: 4.08 MIL/uL (ref 3.87–5.11)
RDW: 14.9 % (ref 11.5–15.5)
WBC: 9.1 10*3/uL (ref 4.0–10.5)

## 2016-02-18 LAB — PROTIME-INR
INR: 1.22 (ref 0.00–1.49)
PROTHROMBIN TIME: 15.6 s — AB (ref 11.6–15.2)

## 2016-02-18 LAB — MRSA PCR SCREENING: MRSA BY PCR: NEGATIVE

## 2016-02-18 LAB — APTT: APTT: 23 s — AB (ref 24–37)

## 2016-02-18 LAB — LACTIC ACID, PLASMA
Lactic Acid, Venous: 2.3 mmol/L (ref 0.5–1.9)
Lactic Acid, Venous: 1.4 mmol/L (ref 0.5–1.9)

## 2016-02-18 LAB — I-STAT CG4 LACTIC ACID, ED: Lactic Acid, Venous: 4.53 mmol/L (ref 0.5–1.9)

## 2016-02-18 LAB — PROCALCITONIN: Procalcitonin: 2.42 ng/mL

## 2016-02-18 MED ORDER — ACETAMINOPHEN 650 MG RE SUPP
650.0000 mg | Freq: Four times a day (QID) | RECTAL | Status: DC | PRN
Start: 2016-02-18 — End: 2016-02-24

## 2016-02-18 MED ORDER — VANCOMYCIN HCL IN DEXTROSE 1-5 GM/200ML-% IV SOLN: 1000.0000 mg | Freq: Once | INTRAVENOUS | Status: DC

## 2016-02-18 MED ORDER — ONDANSETRON HCL 4 MG PO TABS: 4.0000 mg | ORAL_TABLET | Freq: Four times a day (QID) | ORAL | Status: DC | PRN

## 2016-02-18 MED ORDER — LEVOTHYROXINE SODIUM 100 MCG PO TABS
100.0000 ug | ORAL_TABLET | Freq: Every day | ORAL | Status: DC
  Administered 2016-02-19 – 2016-02-24 (×6): 100 ug via ORAL
  Filled 2016-02-18 (×6): qty 1

## 2016-02-18 MED ORDER — SODIUM CHLORIDE 0.9% FLUSH
3.0000 mL | Freq: Two times a day (BID) | INTRAVENOUS | Status: DC
Start: 2016-02-18 — End: 2016-02-24
  Administered 2016-02-20 – 2016-02-22 (×2): 3 mL via INTRAVENOUS

## 2016-02-18 MED ORDER — ALBUTEROL SULFATE HFA 108 (90 BASE) MCG/ACT IN AERS: 2.0000 | INHALATION_SPRAY | Freq: Four times a day (QID) | RESPIRATORY_TRACT | Status: DC | PRN

## 2016-02-18 MED ORDER — ACETAMINOPHEN 325 MG PO TABS: 650.0000 mg | ORAL_TABLET | Freq: Four times a day (QID) | ORAL | Status: DC | PRN

## 2016-02-18 MED ORDER — SODIUM CHLORIDE 0.9 % IV BOLUS (SEPSIS)
1000.0000 mL | Freq: Once | INTRAVENOUS | Status: AC
  Administered 2016-02-18: 1000 mL via INTRAVENOUS

## 2016-02-18 MED ORDER — MIRTAZAPINE 15 MG PO TABS
15.0000 mg | ORAL_TABLET | Freq: Every day | ORAL | Status: DC
  Administered 2016-02-18 – 2016-02-23 (×6): 15 mg via ORAL
  Filled 2016-02-18 (×6): qty 1

## 2016-02-18 MED ORDER — ALBUTEROL SULFATE (2.5 MG/3ML) 0.083% IN NEBU: 2.5000 mg | INHALATION_SOLUTION | Freq: Four times a day (QID) | RESPIRATORY_TRACT | Status: DC | PRN

## 2016-02-18 MED ORDER — SODIUM CHLORIDE 0.9 % IV BOLUS (SEPSIS)
250.0000 mL | Freq: Once | INTRAVENOUS | Status: AC
  Administered 2016-02-18: 250 mL via INTRAVENOUS

## 2016-02-18 MED ORDER — ACETAMINOPHEN 325 MG PO TABS
650.0000 mg | ORAL_TABLET | Freq: Once | ORAL | Status: AC
  Administered 2016-02-18: 650 mg via ORAL

## 2016-02-18 MED ORDER — PIPERACILLIN-TAZOBACTAM 3.375 G IVPB 30 MIN: 3.3750 g | Freq: Once | INTRAVENOUS | Status: DC

## 2016-02-18 MED ORDER — SODIUM CHLORIDE 0.9 % IV SOLN
INTRAVENOUS | Status: DC
  Administered 2016-02-18: 75 mL/h via INTRAVENOUS

## 2016-02-18 MED ORDER — SODIUM CHLORIDE 0.9 % IV BOLUS (SEPSIS)
500.0000 mL | Freq: Once | INTRAVENOUS | Status: AC
  Administered 2016-02-18: 500 mL via INTRAVENOUS

## 2016-02-18 MED ORDER — PIPERACILLIN-TAZOBACTAM 3.375 G IVPB 30 MIN
3.3750 g | Freq: Once | INTRAVENOUS | Status: AC
  Administered 2016-02-18: 3.375 g via INTRAVENOUS
  Filled 2016-02-18: qty 50

## 2016-02-18 MED ORDER — ACETAMINOPHEN 325 MG PO TABS
ORAL_TABLET | ORAL | Status: AC
  Filled 2016-02-18: qty 2

## 2016-02-18 MED ORDER — VANCOMYCIN HCL IN DEXTROSE 750-5 MG/150ML-% IV SOLN
750.0000 mg | INTRAVENOUS | Status: DC
  Filled 2016-02-18: qty 150

## 2016-02-18 MED ORDER — PIPERACILLIN-TAZOBACTAM 3.375 G IVPB
3.3750 g | Freq: Three times a day (TID) | INTRAVENOUS | Status: DC
  Administered 2016-02-18 – 2016-02-20 (×5): 3.375 g via INTRAVENOUS
  Filled 2016-02-18 (×8): qty 50

## 2016-02-18 MED ORDER — SODIUM CHLORIDE 0.9 % IV SOLN
1250.0000 mg | Freq: Once | INTRAVENOUS | Status: AC
  Administered 2016-02-18: 1250 mg via INTRAVENOUS
  Filled 2016-02-18: qty 1250

## 2016-02-18 MED ORDER — ONDANSETRON HCL 4 MG/2ML IJ SOLN: 4.0000 mg | Freq: Four times a day (QID) | INTRAMUSCULAR | Status: DC | PRN

## 2016-02-18 NOTE — H&P (Signed)
History and Physical    Taylor Bass ZOX:[id number] DOB: 1933-08-28 DOA: 02/18/2016  PCP: Jackie Plum, MD Patient coming from: home  Chief Complaint: fever/rigors  HPI: Taylor Bass is a very pleasant 82 y.o. female with medical history significant for hypertension, tobacco use, presents to the emergency department from home with the chief complaint of fever and chills. Initial evaluation reveals sepsis with unknown source.  Information is obtained from the patient and the son who is at the bedside. She was in her usual state of health until this morning she developed sudden fever and "shaking all over. Associated symptoms include increased coughing according to the son particularly at night. She is a heavy smoker. She denies any headache dizziness chest pain palpitation nausea vomiting diarrhea dysuria hematuria frequency or urgency. She denies diarrhea constipation melena bright red blood per rectum. She denies lower extremity edema or arthropathy of. She does report decreased appetite with unintentional weight loss over the last several months.   ED Course: In the emergency department she's hypotensive with tachycardia with max temperature 102.4 orally. Provided with broad-spectrum antibiotics fluid resuscitation. At point of admission temperature 99.5 blood pressure 90/52 with a heart rate of 80. She's not hypoxic and nontoxic appearing  Review of Systems: As per HPI otherwise 10 point review of systems negative.   Ambulatory Status: Ambulates at home with a cane. Denies any recent falls. Fairly independent with ADLs  Past Medical History:  Diagnosis Date  . Hypertension   . Sepsis (HCC)   . Thrombocytopenia (HCC)   . Tobacco use     History reviewed. No pertinent surgical history.  Social History   Social History  . Marital status: Single    Spouse name: N/A  . Number of children: N/A  . Years of education: N/A   Occupational History  . Not on file.   Social  History Main Topics  . Smoking status: Current Every Day Smoker    Packs/day: 1.00    Types: Cigarettes  . Smokeless tobacco: Never Used  . Alcohol use No  . Drug use: No  . Sexual activity: Not on file   Other Topics Concern  . Not on file   Social History Narrative   ** Merged History Encounter **        No Known Allergies  Family History  Problem Relation Age of Onset  . CAD Father   . Hypertension Sister     Prior to Admission medications   Medication Sig Start Date End Date Taking? Authorizing Provider  atorvastatin (LIPITOR) 80 MG tablet Take 80 mg by mouth every evening. 01/18/16  Yes Historical Provider, MD  levothyroxine (SYNTHROID, LEVOTHROID) 100 MCG tablet Take 100 mcg by mouth daily. 01/23/16  Yes Historical Provider, MD  losartan-hydrochlorothiazide (HYZAAR) 100-25 MG per tablet Take 1 tablet by mouth daily.   Yes Historical Provider, MD  mirtazapine (REMERON) 15 MG tablet Take 15 mg by mouth at bedtime. 01/18/16  Yes Historical Provider, MD  potassium chloride (K-DUR) 10 MEQ tablet Take 10 mEq by mouth daily. 01/18/16  Yes Historical Provider, MD  PROAIR HFA 108 (90 Base) MCG/ACT inhaler Inhale 2 puffs into the lungs 4 (four) times daily as needed for wheezing or shortness of breath. 01/18/16  Yes Historical Provider, MD  urea 10 % lotion Apply 1 application topically 2 (two) times daily. 01/19/16  Yes Historical Provider, MD  diphenhydrAMINE (BENADRYL) 25 MG tablet Take 1 tablet (25 mg total) by mouth every 6 (six) hours as needed  for itching. X Patient not taking: Reported on 02/18/2016 05/22/12   Trixie Dredge, PA-C    Physical Exam: Vitals:   02/18/16 1430 02/18/16 1507 02/18/16 1526 02/18/16 1529  BP:  (!) 96/51 (!) 90/52 (!) 90/52  Pulse: 83 78 78 80  Resp: 17 18  25   Temp:    99.5 F (37.5 C)  TempSrc:    Oral  SpO2: 95% 95% 94% 99%  Weight:      Height:         General:  Appears calm and comfortable somewhat acutely ill, no acute distress Eyes:   PERRL, EOMI, normal lids, iris ENT:  grossly normal hearing, lips & tongue, mucous membranes of her mouth are pink only slightly dry Neck:  no LAD, masses or thyromegaly Cardiovascular:  RRR with occasional ectopic beat, no m/r/g. No LE edema.  Respiratory:  CTA bilaterally somewhat distant and only slightly coarse I hear no wheeze no crackles Abdomen:  soft, ntnd, positive bowel sounds no guarding or rebounding Skin:  no rash or induration seen on limited exam Musculoskeletal:  grossly normal tone BUE/BLE, good ROM, no bony abnormality Psychiatric:  grossly normal mood and affect, speech fluent and appropriate, AOx3 Neurologic:  CN 2-12 grossly intact, moves all extremities in coordinated fashion, sensation intact  Labs on Admission: I have personally reviewed following labs and imaging studies  CBC:  Recent Labs Lab 02/18/16 1319  WBC 9.1  NEUTROABS 7.5  HGB 12.6  HCT 39.0  MCV 95.6  PLT 108*   Basic Metabolic Panel:  Recent Labs Lab 02/18/16 1319  NA 137  K 3.9  CL 104  CO2 25  GLUCOSE 108*  BUN 21*  CREATININE 0.96  CALCIUM 9.2   GFR: Estimated Creatinine Clearance: 38.3 mL/min (by C-G formula based on SCr of 0.96 mg/dL). Liver Function Tests:  Recent Labs Lab 02/18/16 1319  AST 232*  ALT 128*  ALKPHOS 126  BILITOT 0.6  PROT 8.3*  ALBUMIN 3.2*   No results for input(s): LIPASE, AMYLASE in the last 168 hours. No results for input(s): AMMONIA in the last 168 hours. Coagulation Profile: No results for input(s): INR, PROTIME in the last 168 hours. Cardiac Enzymes: No results for input(s): CKTOTAL, CKMB, CKMBINDEX, TROPONINI in the last 168 hours. BNP (last 3 results) No results for input(s): PROBNP in the last 8760 hours. HbA1C: No results for input(s): HGBA1C in the last 72 hours. CBG: No results for input(s): GLUCAP in the last 168 hours. Lipid Profile: No results for input(s): CHOL, HDL, LDLCALC, TRIG, CHOLHDL, LDLDIRECT in the last 72  hours. Thyroid Function Tests: No results for input(s): TSH, T4TOTAL, FREET4, T3FREE, THYROIDAB in the last 72 hours. Anemia Panel: No results for input(s): VITAMINB12, FOLATE, FERRITIN, TIBC, IRON, RETICCTPCT in the last 72 hours. Urine analysis:    Component Value Date/Time   COLORURINE YELLOW 02/18/2016 1305   APPEARANCEUR CLEAR 02/18/2016 1305   LABSPEC 1.015 02/18/2016 1305   PHURINE 7.5 02/18/2016 1305   GLUCOSEU NEGATIVE 02/18/2016 1305   HGBUR NEGATIVE 02/18/2016 1305   BILIRUBINUR NEGATIVE 02/18/2016 1305   KETONESUR NEGATIVE 02/18/2016 1305   PROTEINUR NEGATIVE 02/18/2016 1305   UROBILINOGEN 0.2 10/09/2011 1550   NITRITE NEGATIVE 02/18/2016 1305   LEUKOCYTESUR NEGATIVE 02/18/2016 1305    Creatinine Clearance: Estimated Creatinine Clearance: 38.3 mL/min (by C-G formula based on SCr of 0.96 mg/dL).  Sepsis Labs: @LABRCNTIP (procalcitonin:4,lacticidven:4) ) Recent Results (from the past 240 hour(s))  Culture, blood (Routine x 2)     Status:  None (Preliminary result)   Collection Time: 02/18/16  1:45 PM  Result Value Ref Range Status   Specimen Description BLOOD LEFT ANTECUBITAL  Final   Special Requests BOTTLES DRAWN AEROBIC AND ANAEROBIC  5CC  Final   Culture PENDING  Incomplete   Report Status PENDING  Incomplete     Radiological Exams on Admission: Dg Chest 2 View  Result Date: 02/18/2016 CLINICAL DATA:  Healing bed, fever, chills EXAM: CHEST  2 VIEW COMPARISON:  10/09/2011 FINDINGS: Lungs are clear.  No pleural effusion or pneumothorax. The heart is normal in size. Mild degenerative changes of the visualized thoracolumbar spine. IMPRESSION: No evidence of acute cardiopulmonary disease. Electronically Signed   By: Charline Bills M.D.   On: 02/18/2016 15:02   EKG: Pending  Assessment/Plan Principal Problem:   Sepsis (HCC) Active Problems:   Elevated AST (SGOT)   Elevated ALT measurement   Thrombocytopenia (HCC)   Fever   Lactic acidosis   Tobacco  abuse   Hypotension   #1. Sepsis. Source unclear. Elevated lactic acid, tachycardia, hypotension, fever Chest x-ray with lungs clear no pleural effusion no evidence of acute cardiopulmonary disease. No leukocytosis. Improving on admission. She is provided with fluid resuscitation and broad-spectrum antibiotics. No noted rashes or tick bites. -Admit to step down -Continue IV fluids -Continue antibiotics -Follow blood culture -Follow urine culture -Track lactic acid -Monitor closely  #2. Lactic acidosis, hypotension, tachycardia. Related to above. Patient mentating. Improving on admission. -Continue IV fluids -Track lactic acid -Continue antibiotics -Monitor on step down  #3. Elevated AST and ALT. Alkaline phosphatase and total bili within the limits of normal. No history of EtOH. Abdominal exam benign.  #4. Thrombocytopenia. May be chronic. Chart review indicates platelets within the limits of normal 1 year ago for years ago platelets 104. Platelets on admission 108. No reports of EtOH use. No signs symptoms of active bleeding -Monitor  #5. Hypertension. Home medications include losartan and hydrochlorothiazide. Patient's blood pressure quite low while in the emergency department. At the point of admission blood pressure low end of normal. -Continue IV fluids -Hold losartan and hydrochlorothiazide  #6. Tobacco use. Home medications include inhaler. Suspect some copd. oxygen saturation levels greater than 90% on room air. -Continue inhaler -Cessation counseling offered    DVT prophylaxis: scd  Code Status: full  Family Communication: son at bedside  Disposition Plan: home  Consults called: none  Admission status: inpatient    Gwenyth Bender MD [facility]  If 7PM-7AM, please contact night-coverage [email or web address] Password [facility]  02/18/2016, 3:57 PM

## 2016-02-18 NOTE — ED Notes (Signed)
Radiology made aware patient is ready for xray when they are ready to take her over

## 2016-02-18 NOTE — Progress Notes (Signed)
CRITICAL VALUE ALERT  Critical value received: lactic  Date of notification:  02/18/2016  Time of notification:  1807  Critical value read back:yes  Nurse who received alert:  Tammy Sours  MD notified (1st page): not called, expected result  Time of first page:    MD notified (2nd page):  Time of second page:  Responding MD:    Time MD responded:

## 2016-02-18 NOTE — Progress Notes (Signed)
Pharmacy Antibiotic Note  Taylor Bass is a 82 y.o. female admitted on 02/18/2016 with sepsis.  Pharmacy has been consulted for vancomycin and zosyn dosing. Patient with SCr 0.96 and estimated CrCl ~35 ml/min.  Plan: Vancomycin 1250 mg x1 loading dose Vancomycin 750 IV every 24 hours.  Goal trough 15-20 mcg/mL. Zosyn 3.375g IV q8h (4 hour infusion).  Monitor renal function, C&S, CBC and clinical progression Monitor vanc trough at steady state as needed  Weight: 112 lb 7 oz (51 kg)  Temp (24hrs), Avg:102.4 F (39.1 C), Min:102.4 F (39.1 C), Max:102.4 F (39.1 C)   Recent Labs Lab 02/18/16 1319 02/18/16 1332  WBC 9.1  --   CREATININE 0.96  --   LATICACIDVEN  --  4.53*    CrCl cannot be calculated (Unknown ideal weight.).    No Known Allergies  Antimicrobials this admission: 7/23 Vancomycin >>  7/23 Zosyn >>   Microbiology results: 7/23 BCx:  7/23 UCx:    Thank you for allowing pharmacy to be a part of this patient's care.  Casilda Carls, PharmD. PGY-2 Pharmacy Resident Pager: [phone] 02/18/2016 2:08 PM

## 2016-02-18 NOTE — ED Provider Notes (Signed)
[facility]-EMERGENCY DEPT Provider Note   CSN: [id number] Arrival date & time: 02/18/16  1253  First Provider Contact:  First MD Initiated Contact with Patient 02/18/16 1332        History   Chief Complaint No chief complaint on file.   HPI Taylor Bass is a 82 y.o. female.  HPI Taylor Bass is a 82 y.o. female with PMH significant for HTN who presents with fever and chills. Temp on arrival 102.4. Patient family members report she has been in her usual state of health and then began complaining of chills and shaking couple of hours ago. Patient denies any chest pain, shortness of breath, abdominal pain, urinary symptoms, sore throat. Nothing tried prior to arrival. No aggravating or modifying factors. Her symptoms were sudden in onset, constant and unchanging.  Past Medical History:  Diagnosis Date  . Hypertension     Patient Active Problem List   Diagnosis Date Noted  . Sepsis (HCC) 02/18/2016  . Elevated AST (SGOT) 02/18/2016  . Elevated ALT measurement 02/18/2016  . Severe sepsis (HCC) 02/18/2016  . Hypertension     History reviewed. No pertinent surgical history.  OB History    Gravida Para Term Preterm AB Living   0 0 0 0 0     SAB TAB Ectopic Multiple Live Births   0 0 0           Home Medications    Prior to Admission medications   Medication Sig Start Date End Date Taking? Authorizing Provider  atorvastatin (LIPITOR) 80 MG tablet Take 80 mg by mouth every evening. 01/18/16  Yes Historical Provider, MD  levothyroxine (SYNTHROID, LEVOTHROID) 100 MCG tablet Take 100 mcg by mouth daily. 01/23/16  Yes Historical Provider, MD  losartan-hydrochlorothiazide (HYZAAR) 100-25 MG per tablet Take 1 tablet by mouth daily.   Yes Historical Provider, MD  mirtazapine (REMERON) 15 MG tablet Take 15 mg by mouth at bedtime. 01/18/16  Yes Historical Provider, MD  potassium chloride (K-DUR) 10 MEQ tablet Take 10 mEq by mouth daily. 01/18/16  Yes Historical Provider, MD  PROAIR  HFA 108 (90 Base) MCG/ACT inhaler Inhale 2 puffs into the lungs 4 (four) times daily as needed for wheezing or shortness of breath. 01/18/16  Yes Historical Provider, MD  urea 10 % lotion Apply 1 application topically 2 (two) times daily. 01/19/16  Yes Historical Provider, MD  diphenhydrAMINE (BENADRYL) 25 MG tablet Take 1 tablet (25 mg total) by mouth every 6 (six) hours as needed for itching. X Patient not taking: Reported on 02/18/2016 05/22/12   Trixie Dredge, PA-C    Family History No family history on file.  Social History Social History  Substance Use Topics  . Smoking status: Current Every Day Smoker    Packs/day: 1.00    Types: Cigarettes  . Smokeless tobacco: Never Used  . Alcohol use No     Allergies   Review of patient's allergies indicates no known allergies.   Review of Systems Review of Systems All other systems negative unless otherwise stated in HPI   Physical Exam Updated Vital Signs BP (!) 96/51   Pulse 78   Temp (S) 100.1 F (37.8 C) (Oral)   Resp 18   Ht 5\' 5"  (1.651 m)   Wt 51 kg   SpO2 94%   BMI 18.71 kg/m   Physical Exam  Constitutional: She is oriented to person, place, and time. She appears well-developed and well-nourished.  Non-toxic appearance. She does not have a sickly  appearance. She does not appear ill.  HENT:  Head: Normocephalic and atraumatic.  Mouth/Throat: Oropharynx is clear and moist.  Eyes: Conjunctivae are normal. Pupils are equal, round, and reactive to light.  Neck: Normal range of motion. Neck supple.  Cardiovascular: Regular rhythm.  Tachycardia present.   HR 110 on monitor.  Pulmonary/Chest: Effort normal and breath sounds normal. No accessory muscle usage or stridor. No respiratory distress. She has no wheezes. She has no rhonchi. She has no rales.  Abdominal: Soft. Bowel sounds are normal. She exhibits no distension and no mass. There is no tenderness. There is no rebound and no guarding.  Musculoskeletal: Normal range of  motion.  Lymphadenopathy:    She has no cervical adenopathy.  Neurological: She is alert and oriented to person, place, and time.  Speech clear without dysarthria.  Skin: Skin is warm and dry.  Psychiatric: She has a normal mood and affect. Her behavior is normal.     ED Treatments / Results  Labs (all labs ordered are listed, but only abnormal results are displayed) Labs Reviewed  COMPREHENSIVE METABOLIC PANEL - Abnormal; Notable for the following:       Result Value   Glucose, Bld 108 (*)    BUN 21 (*)    Total Protein 8.3 (*)    Albumin 3.2 (*)    AST 232 (*)    ALT 128 (*)    GFR calc non Af Amer 55 (*)    All other components within normal limits  CBC WITH DIFFERENTIAL/PLATELET - Abnormal; Notable for the following:    Platelets 108 (*)    All other components within normal limits  I-STAT CG4 LACTIC ACID, ED - Abnormal; Notable for the following:    Lactic Acid, Venous 4.53 (*)    All other components within normal limits  CULTURE, BLOOD (ROUTINE X 2)  CULTURE, BLOOD (ROUTINE X 2)  URINE CULTURE  URINALYSIS, ROUTINE W REFLEX MICROSCOPIC (NOT AT [facility])    EKG  EKG Interpretation  Date/Time:  Sunday February 18 2016 13:56:32 EDT Ventricular Rate:  90 PR Interval:    QRS Duration: 99 QT Interval:  364 QTC Calculation: 446 R Axis:   64 Text Interpretation:  Sinus rhythm Low voltage, extremity and precordial leads Abnormal R-wave progression, early transition Nonspecific T abnrm, anterolateral leads No STEMI.  Confirmed by LONG MD, JOSHUA [phone]) on 02/18/2016 3:15:01 PM       Radiology Dg Chest 2 View  Result Date: 02/18/2016 CLINICAL DATA:  Healing bed, fever, chills EXAM: CHEST  2 VIEW COMPARISON:  10/09/2011 FINDINGS: Lungs are clear.  No pleural effusion or pneumothorax. The heart is normal in size. Mild degenerative changes of the visualized thoracolumbar spine. IMPRESSION: No evidence of acute cardiopulmonary disease. Electronically Signed   By: Charline Bills M.D.   On: 02/18/2016 15:02   Procedures Procedures (including critical care time)  CRITICAL CARE Performed by: Cheri Fowler   Total critical care time: 35 minutes  Critical care time was exclusive of separately billable procedures and treating other patients.  Critical care was necessary to treat or prevent imminent or life-threatening deterioration.  Critical care was time spent personally by me on the following activities: development of treatment plan with patient and/or surrogate as well as nursing, discussions with consultants, evaluation of patient's response to treatment, examination of patient, obtaining history from patient or surrogate, ordering and performing treatments and interventions, ordering and review of laboratory studies, ordering and review of radiographic studies, pulse oximetry  and re-evaluation of patient's condition.  Medications Ordered in ED Medications  vancomycin (VANCOCIN) 1,250 mg in sodium chloride 0.9 % 250 mL IVPB (1,250 mg Intravenous New Bag/Given 02/18/16 1426)  piperacillin-tazobactam (ZOSYN) IVPB 3.375 g (0 g Intravenous Stopped 02/18/16 1505)    Followed by  piperacillin-tazobactam (ZOSYN) IVPB 3.375 g (not administered)  vancomycin (VANCOCIN) IVPB 750 mg/150 ml premix (not administered)  acetaminophen (TYLENOL) tablet 650 mg (650 mg Oral Given 02/18/16 1306)  sodium chloride 0.9 % bolus 1,000 mL (1,000 mLs Intravenous New Bag/Given 02/18/16 1348)    And  sodium chloride 0.9 % bolus 500 mL (500 mLs Intravenous New Bag/Given 02/18/16 1348)    And  sodium chloride 0.9 % bolus 250 mL (250 mLs Intravenous New Bag/Given 02/18/16 1355)     Initial Impression / Assessment and Plan / ED Course  I have reviewed the triage vital signs and the nursing notes.  Pertinent labs & imaging results that were available during my care of the patient were reviewed by me and considered in my medical decision making (see chart for details).  Clinical Course    Value Comment By Time  Lactic Acid, Venous: (!!) 4.53 Patient receiving weight based fluids and IV vanc and zosyn for sepsis unknown source.  Cheri Fowler, PA-C 07/23 1340   Patient presents with fever and chills. Temperature 102.4. Patient is tachycardic. She is normotensive. Denies chest pain, shortness breath, cough, abdominal pain, sore throat, or urinary symptoms. No abdominal tenderness on exam. Lactic acid 4.5. Patient meets criteria for severe sepsis.  Code sepsis called and workup initiated. Weight-based IV fluids, IV Vanc and Zosyn started as well.  Labs largely without acute abnormalities. UA without signs of infection. Chest x-ray normal. Blood cultures pending. At this point, patient will benefit from admission for sepsis of unknown source with lactic acidosis.  Case has been discussed with Dr. Jacqulyn Bath who agrees with the above plan for admission.   Final Clinical Impressions(s) / ED Diagnoses   Final diagnoses:  Severe sepsis [facility])    New Prescriptions New Prescriptions   No medications on file     Cheri Fowler, PA-C 02/18/16 1522    Maia Plan, MD 02/18/16 [phone]

## 2016-02-18 NOTE — ED Notes (Signed)
1st set of blood cultures collected and in lab

## 2016-02-18 NOTE — ED Triage Notes (Signed)
Patient here with family who report that patient developed chills 1 hour pta. Patient alert to person and place but confused to day of week. Had cold symptoms a few weeks ago but no other illnesses. Patient denies pain. Skin hot to touch and chills/tremors on arrival

## 2016-02-19 ENCOUNTER — Inpatient Hospital Stay (HOSPITAL_COMMUNITY): Payer: Medicare Other

## 2016-02-19 LAB — COMPREHENSIVE METABOLIC PANEL
ALBUMIN: 2.3 g/dL — AB (ref 3.5–5.0)
ALK PHOS: 96 U/L (ref 38–126)
ALT: 172 U/L — AB (ref 14–54)
ANION GAP: 3 — AB (ref 5–15)
AST: 213 U/L — AB (ref 15–41)
BILIRUBIN TOTAL: 0.6 mg/dL (ref 0.3–1.2)
BUN: 15 mg/dL (ref 6–20)
CALCIUM: 8.1 mg/dL — AB (ref 8.9–10.3)
CO2: 27 mmol/L (ref 22–32)
Chloride: 108 mmol/L (ref 101–111)
Creatinine, Ser: 0.91 mg/dL (ref 0.44–1.00)
GFR calc Af Amer: >60 mL/min (ref >60)
GFR calc non Af Amer: 58 mL/min — ABNORMAL LOW (ref >60)
GLUCOSE: 83 mg/dL (ref 65–99)
Potassium: 3.6 mmol/L (ref 3.5–5.1)
SODIUM: 138 mmol/L (ref 135–145)
Total Protein: 6.1 g/dL — ABNORMAL LOW (ref 6.5–8.1)

## 2016-02-19 LAB — HEPATITIS PANEL, ACUTE
Hep A IgM: NEGATIVE
Hep B C IgM: NEGATIVE
Hepatitis B Surface Ag: NEGATIVE

## 2016-02-19 LAB — BLOOD CULTURE ID PANEL (REFLEXED)
ACINETOBACTER BAUMANNII: NOT DETECTED
CANDIDA KRUSEI: NOT DETECTED
CANDIDA TROPICALIS: NOT DETECTED
CARBAPENEM RESISTANCE: NOT DETECTED
Candida albicans: NOT DETECTED
Candida glabrata: NOT DETECTED
Candida parapsilosis: NOT DETECTED
Enterobacter cloacae complex: NOT DETECTED
Enterobacteriaceae species: DETECTED — AB
Enterococcus species: NOT DETECTED
Escherichia coli: NOT DETECTED
HAEMOPHILUS INFLUENZAE: NOT DETECTED
KLEBSIELLA OXYTOCA: DETECTED — AB
Klebsiella pneumoniae: NOT DETECTED
LISTERIA MONOCYTOGENES: NOT DETECTED
Methicillin resistance: NOT DETECTED
NEISSERIA MENINGITIDIS: NOT DETECTED
PROTEUS SPECIES: NOT DETECTED
Pseudomonas aeruginosa: NOT DETECTED
SERRATIA MARCESCENS: NOT DETECTED
STAPHYLOCOCCUS SPECIES: NOT DETECTED
STREPTOCOCCUS PYOGENES: NOT DETECTED
STREPTOCOCCUS SPECIES: NOT DETECTED
Staphylococcus aureus (BCID): NOT DETECTED
Streptococcus agalactiae: NOT DETECTED
Streptococcus pneumoniae: NOT DETECTED
Vancomycin resistance: NOT DETECTED

## 2016-02-19 LAB — GLUCOSE, CAPILLARY: GLUCOSE-CAPILLARY: 83 mg/dL (ref 65–99)

## 2016-02-19 LAB — URINE CULTURE

## 2016-02-19 LAB — CBC
HCT: 32.5 % — ABNORMAL LOW (ref 36.0–46.0)
HEMOGLOBIN: 10.1 g/dL — AB (ref 12.0–15.0)
MCH: 29.7 pg (ref 26.0–34.0)
MCHC: 31.1 g/dL (ref 30.0–36.0)
MCV: 95.6 fL (ref 78.0–100.0)
Platelets: 90 10*3/uL — ABNORMAL LOW (ref 150–400)
RBC: 3.4 MIL/uL — ABNORMAL LOW (ref 3.87–5.11)
RDW: 14.8 % (ref 11.5–15.5)
WBC: 7.9 10*3/uL (ref 4.0–10.5)

## 2016-02-19 MED ORDER — SODIUM CHLORIDE 0.9 % IV BOLUS (SEPSIS)
500.0000 mL | Freq: Once | INTRAVENOUS | Status: AC
  Administered 2016-02-19: 500 mL via INTRAVENOUS

## 2016-02-19 MED ORDER — SODIUM CHLORIDE 0.9 % IV SOLN
INTRAVENOUS | Status: DC
  Administered 2016-02-19: 08:00:00 via INTRAVENOUS

## 2016-02-19 MED ORDER — ENSURE ENLIVE PO LIQD
237.0000 mL | Freq: Two times a day (BID) | ORAL | Status: DC
  Administered 2016-02-20 – 2016-02-24 (×7): 237 mL via ORAL

## 2016-02-19 NOTE — Progress Notes (Signed)
Initial Nutrition Assessment  DOCUMENTATION CODES:   Not applicable  INTERVENTION:   -Ensure Enlive po BID, each supplement provides 350 kcal and 20 grams of protein  NUTRITION DIAGNOSIS:   Predicted suboptimal nutrient intake related to poor appetite as evidenced by per patient/family report.  GOAL:   Patient will meet greater than or equal to 90% of their needs  MONITOR:   PO intake, Supplement acceptance, Labs, Weight trends, Skin, I & O's  REASON FOR ASSESSMENT:   Consult Assessment of nutrition requirement/status  ASSESSMENT:   Taylor Bass is a very pleasant 82 y.o. female with medical history significant for hypertension, tobacco use, presents to the emergency department from home with the chief complaint of fever and chills. Initial evaluation reveals sepsis with unknown source.  Pt admitted with sepsis. Noted pt is a recovering alcoholic, per chart review.   Spoke with pt at bedside. She reports poor appetite and weight loss over the past 2 months. She shares "I haven't eaten anything because I wasn't feeling hungry". She reports that she was drinking mainly coffee at home and did not  Provide further diet hx when probed by this RD. She denies any difficulty chewing, swallowing, or taste changes. She feels like her appetite is returning, reporting she consumed 75% of her breakfast this AM. Per discussion with RN, pt appeared very eager to receive food this AM.   Pt reports UBW around 110#. No weight hx available to assess changes at this time. Noted that pt has a small framed body type. Nutrition-Focused physical exam completed. Findings are mild to moderate fat depletion, mild to moderate muscle depletion, and no edema. Pt shares that her activity level has declined over the past few months and has started walking with a cane. Suspect fat and muscle loss is multifactorial.   Pt reports she consumed one Boost per week, per her report. She is amenable to supplements  during hospitalization.  Case discussed with RN.   Medications reviewed and include remeron.   Labs reviewed.  Diet Order:  Diet Heart Room service appropriate? Yes; Fluid consistency: Thin  Skin:  Reviewed, no issues  Last BM:  02/19/16  Height:   Ht Readings from Last 1 Encounters:  02/18/16 5\' 5"  (1.651 m)    Weight:   Wt Readings from Last 1 Encounters:  02/18/16 112 lb 7 oz (51 kg)    Ideal Body Weight:  56.8 kg  BMI:  Body mass index is 18.71 kg/m.  Estimated Nutritional Needs:   Kcal:  1400-1600  Protein:  60-75 grams  Fluid:  1.4-1.6 L  EDUCATION NEEDS:   Education needs addressed  Michaiah Holsopple A. Mayford Knife, RD, LDN, CDE Pager: [phone] After hours Pager: [phone]

## 2016-02-19 NOTE — Evaluation (Signed)
Physical Therapy Evaluation Patient Details Name: Taylor Bass MRN: [id number] DOB: February 16, 1934 Today's Date: 02/19/2016   History of Present Illness  Pt is a 82 y/o F admitted on 7/23 with c/o fevers, chills, found to be febrile with temperature of 102, hypotensive. Admitted for sepsis.  No clear source of infection.   Clinical Impression  Pt admitted with above diagnosis. Pt currently with functional limitations due to the deficits listed below (see PT Problem List). Taylor Bass presents with generalized weakness and requires min assist to ambulate short distance in room using her quad cane, which pt was using PTA.  She requires assist for some ADLs and will continue to have 24/7 assist/supervision available from family at d/c. Pt will benefit from skilled PT to increase their independence and safety with mobility to allow discharge to the venue listed below.      Follow Up Recommendations Home health PT;Supervision/Assistance - 24 hour    Equipment Recommendations  None recommended by PT    Recommendations for Other Services OT consult     Precautions / Restrictions Precautions Precautions: Fall Restrictions Weight Bearing Restrictions: No      Mobility  Bed Mobility Overal bed mobility: Needs Assistance Bed Mobility: Supine to Sit     Supine to sit: Min guard;HOB elevated     General bed mobility comments: Increased time and effort  Transfers Overall transfer level: Needs assistance Equipment used: Quad cane Transfers: Sit to/from Stand Sit to Stand: Min assist         General transfer comment: Min assist to steady as pt stands slowly  Ambulation/Gait Ambulation/Gait assistance: Min Environmental consultant (Feet): 50 Feet Assistive device: Quad cane Gait Pattern/deviations: Step-through pattern;Decreased stride length;Trunk flexed Gait velocity: decreased   General Gait Details: Flexed posutre, cues for upright and forward gaze.  Min assist to steady when  pt maneuvering around obstacles in room.  Stairs            Wheelchair Mobility    Modified Rankin (Stroke Patients Only)       Balance Overall balance assessment: Needs assistance Sitting-balance support: No upper extremity supported;Feet supported Sitting balance-Leahy Scale: Fair     Standing balance support: During functional activity;Single extremity supported Standing balance-Leahy Scale: Poor Standing balance comment: Relies on support from cane                              Pertinent Vitals/Pain Pain Assessment: No/denies pain    Home Living Family/patient expects to be discharged to:: Private residence Living Arrangements: Children;Other relatives (son, daughter, niece) Available Help at Discharge: Family;Available 24 hours/day Type of Home: House Home Access: Stairs to enter Entrance Stairs-Rails: Left Entrance Stairs-Number of Steps: 6 Home Layout: One level Home Equipment: Shower seat;Cane - quad      Prior Function Level of Independence: Needs assistance   Gait / Transfers Assistance Needed: Ambulates short household distances with quad cane.  Denies any falls in the past 6 months.  ADL's / Homemaking Assistance Needed: Family does the driving, cooking, cleaning.  Pt requires assist for bathing.          Hand Dominance        Extremity/Trunk Assessment   Upper Extremity Assessment: Defer to OT evaluation           Lower Extremity Assessment: Generalized weakness      Cervical / Trunk Assessment: Kyphotic  Communication   Communication: No difficulties  Cognition Arousal/Alertness:  Awake/alert Behavior During Therapy: Flat affect Overall Cognitive Status: Within Functional Limits for tasks assessed                      General Comments General comments (skin integrity, edema, etc.): VSS    Exercises General Exercises - Upper Extremity Shoulder Flexion: AROM;Both;10 reps;Seated General Exercises - Lower  Extremity Long Arc Quad: AROM;Both;10 reps;Seated      Assessment/Plan    PT Assessment Patient needs continued PT services  PT Diagnosis Difficulty walking;Generalized weakness   PT Problem List Decreased strength;Decreased activity tolerance;Decreased balance;Decreased mobility;Decreased knowledge of use of DME;Decreased safety awareness  PT Treatment Interventions DME instruction;Stair training;Gait training;Functional mobility training;Therapeutic activities;Therapeutic exercise;Balance training;Patient/family education   PT Goals (Current goals can be found in the Care Plan section) Acute Rehab PT Goals Patient Stated Goal: to feel better and go home PT Goal Formulation: With patient Time For Goal Achievement: 03/04/17 Potential to Achieve Goals: Good    Frequency Min 3X/week   Barriers to discharge        Co-evaluation               End of Session Equipment Utilized During Treatment: Gait belt Activity Tolerance: Patient tolerated treatment well;Patient limited by fatigue Patient left: in chair;with call bell/phone within reach;with chair alarm set Nurse Communication: Mobility status         Time: 1771-1657 PT Time Calculation (min) (ACUTE ONLY): 24 min   Charges:   PT Evaluation $PT Eval Low Complexity: 1 Procedure PT Treatments $Gait Training: 8-22 mins   PT G Codes:       Encarnacion Chu PT, DPT  Pager: [phone] Phone: [phone] 02/19/2016, 2:40 PM

## 2016-02-19 NOTE — Progress Notes (Signed)
PROGRESS NOTE    Taylor Bass  UJW:[id number] DOB: 1934/05/13 DOA: 02/18/2016 PCP: Jackie Plum, MD   Brief Narrative:  Taylor Bass is a pleasant 82 year old female with a past medical history of hypertension, thrombus cytopenia, admitted to the medicine service on 02/18/2016 with complaints of fevers, chills, found to be febrile with temperature 102, hypotensive with systolic blood pressures in the 70s and 80s and a lactic acid of 4.5. Initial workup did not show obvious source of infection as chest x-ray and urinalysis were unremarkable. She was started on broad-spectrum IV antibiotic therapy with vancomycin and Zosyn. On the following day microbiology reported one out of 2 blood cultures positive for gram-negative rods. Lab work revealed elevated transaminases that was further worked up with right upper quadrant ultrasound. Did not show evidence of acute cholecystitis however there was, bile duct dilatation to 8 mm. This was further worked up with MRCP given no other clear source of infection.    Assessment & Plan:   Principal Problem:   Sepsis (HCC) Active Problems:   Elevated AST (SGOT)   Elevated ALT measurement   Thrombocytopenia (HCC)   Fever   Lactic acidosis   Tobacco abuse   Hypotension   Hypothyroidism   1.  Severe Sepsis -Present on admission, evidenced by hypotension with systolic blood pressures in the 80s, elevated lactic acid level of 4.5, temperature 102.4, heart rate of 101 with blood cultures growing gram-negative rods -Source of infection is unclear, this for workup has included chest x-ray and urinalysis which were unrevealing. -Having elevated transaminases she had a right upper quadrant ultrasound that did not show evidence for acute cholecystitis. Radiology did reports dilated common bile duct measuring up to 8 mm. Having no other explanation for source of infection, will further workup with MRCP.  -Meanwhile will continue IV fluid resuscitation, narrow  antibiotic therapy to Zosyn with discontinuation of vancomycin -Will check a transthoracic echocardiogram -Lactic acid levels trended down to 1.4 -Close monitoring in the step down unit  2.  Elevated transaminases -Lab work showing elevated AST of 213 with ALT of 172. Her total bilirubin was normal at 0.6 and Alkaline Phosphatase was 96. This data does not quite fit the cholestatic liver disease pattern. Elevated transaminases could be related to shock liver from sepsis. -However without having identified source of infection, will further workup with MRCP -Continue IV fluids, plan to repeat copper is metabolic panel in a.m.  3.  History of thrombocytopenia -Since 2013 platelet count has fluctuated between 90 and 151  4.  History of hypertension -She had been on Hyzaar in the outpatient setting which was discontinued due to sepsis and hypotension  5.  Hypothyroidism -Continue Synthroid 100 g by mouth daily  -Labs showed TSH of 0.676   DVT prophylaxis: SCD's  Code Status: Full Code Family Communication:  Disposition Plan: Continue care in the SDU  Consultants:     Procedures:     Antimicrobials:   Zosyn started on 02/18/2016  Vancomycin started on 02/18/2016 and discontinued on 02/19/2016   Subjective: Patient reporting feeling a little better this morning, she is awake and alert and mentating well  Objective: Vitals:   02/19/16 0500 02/19/16 0600 02/19/16 0700 02/19/16 0733  BP:    (!) 86/52  Pulse: (!) 51 (!) 50 (!) 49 (!) 58  Resp: Temp:    97.6 F (36.4 C)  TempSrc:    Oral  SpO2: 99% 98% 100% 100%  Weight:  Height:        Intake/Output Summary (Last 24 hours) at 02/19/16 1013 Last data filed at 02/19/16 0740  Gross per 24 hour  Intake              460 ml  Output              575 ml  Net             -115 ml   Filed Weights   02/18/16 1354  Weight: 51 kg (112 lb 7 oz)    Examination:  General exam: Awake and alert, answering  questions appropriately Respiratory system: Clear to auscultation. Respiratory effort normal. Cardiovascular system: Bradycardic S1 & S2 heard, RRR. No JVD, murmurs, rubs, gallops or clicks. No pedal edema. Gastrointestinal system: For abdominal exam was benign, noting she did not have pain to palpation involving right upper quadrant. Central nervous system: Alert and oriented. No focal neurological deficits. Extremities: Symmetric 5 x 5 power. Skin: No rashes, lesions or ulcers Psychiatry: Judgement and insight appear normal. Mood & affect appropriate.     Data Reviewed: I have personally reviewed following labs and imaging studies  CBC:  Recent Labs Lab 02/18/16 1319 02/19/16 0247  WBC 9.1 7.9  NEUTROABS 7.5  --   HGB 12.6 10.1*  HCT 39.0 32.5*  MCV 95.6 95.6  PLT 108* 90*   Basic Metabolic Panel:  Recent Labs Lab 02/18/16 1319 02/19/16 0247  NA 137 138  K 3.9 3.6  CL 104 108  CO2 25 27  GLUCOSE 108* 83  BUN 21* 15  CREATININE 0.96 0.91  CALCIUM 9.2 8.1*   GFR: Estimated Creatinine Clearance: 40.4 mL/min (by C-G formula based on SCr of 0.91 mg/dL). Liver Function Tests:  Recent Labs Lab 02/18/16 1319 02/19/16 0247  AST 232* 213*  ALT 128* 172*  ALKPHOS 126 96  BILITOT 0.6 0.6  PROT 8.3* 6.1*  ALBUMIN 3.2* 2.3*   No results for input(s): LIPASE, AMYLASE in the last 168 hours. No results for input(s): AMMONIA in the last 168 hours. Coagulation Profile:  Recent Labs Lab 02/18/16 1733  INR 1.22   Cardiac Enzymes: No results for input(s): CKTOTAL, CKMB, CKMBINDEX, TROPONINI in the last 168 hours. BNP (last 3 results) No results for input(s): PROBNP in the last 8760 hours. HbA1C: No results for input(s): HGBA1C in the last 72 hours. CBG: No results for input(s): GLUCAP in the last 168 hours. Lipid Profile: No results for input(s): CHOL, HDL, LDLCALC, TRIG, CHOLHDL, LDLDIRECT in the last 72 hours. Thyroid Function Tests:  Recent Labs   02/18/16 1733  TSH 0.676   Anemia Panel: No results for input(s): VITAMINB12, FOLATE, FERRITIN, TIBC, IRON, RETICCTPCT in the last 72 hours. Sepsis Labs:  Recent Labs Lab 02/18/16 1332 02/18/16 1733 02/18/16 1735 02/18/16 2035  PROCALCITON  --  2.42  --   --   LATICACIDVEN 4.53*  --  2.3* 1.4    Recent Results (from the past 240 hour(s))  Culture, blood (Routine x 2)     Status: None (Preliminary result)   Collection Time: 02/18/16  1:16 PM  Result Value Ref Range Status   Specimen Description BLOOD RIGHT ANTECUBITAL  Final   Special Requests BOTTLES DRAWN AEROBIC AND ANAEROBIC  10CC  Final   Culture  Setup Time   Final    GRAM NEGATIVE RODS AEROBIC BOTTLE ONLY CRITICAL RESULT CALLED TO, READ BACK BY AND VERIFIED WITH: Artelia Laroche PHARMD AT 4098 02/19/16 BY D. Leighton Roach  Culture   Final    GRAM NEGATIVE RODS CULTURE REINCUBATED FOR BETTER GROWTH    Report Status PENDING  Incomplete  Blood Culture ID Panel (Reflexed)     Status: Abnormal   Collection Time: 02/18/16  1:16 PM  Result Value Ref Range Status   Enterococcus species NOT DETECTED NOT DETECTED Final   Vancomycin resistance NOT DETECTED NOT DETECTED Final   Listeria monocytogenes NOT DETECTED NOT DETECTED Final   Staphylococcus species NOT DETECTED NOT DETECTED Final   Staphylococcus aureus NOT DETECTED NOT DETECTED Final   Methicillin resistance NOT DETECTED NOT DETECTED Final   Streptococcus species NOT DETECTED NOT DETECTED Final   Streptococcus agalactiae NOT DETECTED NOT DETECTED Final   Streptococcus pneumoniae NOT DETECTED NOT DETECTED Final   Streptococcus pyogenes NOT DETECTED NOT DETECTED Final   Acinetobacter baumannii NOT DETECTED NOT DETECTED Final   Enterobacteriaceae species DETECTED (A) NOT DETECTED Final    Comment: CRITICAL RESULT CALLED TO, READ BACK BY AND VERIFIED WITH: Artelia Laroche PHARMD AT 0925 02/19/16 BY D. VANHOOK    Enterobacter cloacae complex NOT DETECTED NOT DETECTED Final    Escherichia coli NOT DETECTED NOT DETECTED Final   Klebsiella oxytoca DETECTED (A) NOT DETECTED Final    Comment: CRITICAL RESULT CALLED TO, READ BACK BY AND VERIFIED WITH: Artelia Laroche PHARMD AT 0925 02/19/16 BY D. VANHOOK    Klebsiella pneumoniae NOT DETECTED NOT DETECTED Final   Proteus species NOT DETECTED NOT DETECTED Final   Serratia marcescens NOT DETECTED NOT DETECTED Final   Carbapenem resistance NOT DETECTED NOT DETECTED Final   Haemophilus influenzae NOT DETECTED NOT DETECTED Final   Neisseria meningitidis NOT DETECTED NOT DETECTED Final   Pseudomonas aeruginosa NOT DETECTED NOT DETECTED Final   Candida albicans NOT DETECTED NOT DETECTED Final   Candida glabrata NOT DETECTED NOT DETECTED Final   Candida krusei NOT DETECTED NOT DETECTED Final   Candida parapsilosis NOT DETECTED NOT DETECTED Final   Candida tropicalis NOT DETECTED NOT DETECTED Final  Culture, blood (Routine x 2)     Status: None (Preliminary result)   Collection Time: 02/18/16  1:45 PM  Result Value Ref Range Status   Specimen Description BLOOD LEFT ANTECUBITAL  Final   Special Requests BOTTLES DRAWN AEROBIC AND ANAEROBIC  5CC  Final   Culture PENDING  Incomplete   Report Status PENDING  Incomplete  MRSA PCR Screening     Status: None   Collection Time: 02/18/16  6:50 PM  Result Value Ref Range Status   MRSA by PCR NEGATIVE NEGATIVE Final    Comment:        The GeneXpert MRSA Assay (FDA approved for NASAL specimens only), is one component of a comprehensive MRSA colonization surveillance program. It is not intended to diagnose MRSA infection nor to guide or monitor treatment for MRSA infections.          Radiology Studies: Dg Chest 2 View  Result Date: 02/18/2016 CLINICAL DATA:  Healing bed, fever, chills EXAM: CHEST  2 VIEW COMPARISON:  10/09/2011 FINDINGS: Lungs are clear.  No pleural effusion or pneumothorax. The heart is normal in size. Mild degenerative changes of the visualized thoracolumbar  spine. IMPRESSION: No evidence of acute cardiopulmonary disease. Electronically Signed   By: Charline Bills M.D.   On: 02/18/2016 15:02  US Abdomen Limited Ruq  Result Date: 02/18/2016 CLINICAL DATA:  Patient with elevated LFTs. EXAM: US ABDOMEN LIMITED - RIGHT UPPER QUADRANT COMPARISON:  None. FINDINGS: Gallbladder: No gallstones or wall thickening visualized.  No sonographic Murphy sign noted by sonographer. Common bile duct: Diameter: 8 mm Liver: Liver is increased in echogenicity. Within the left hepatic lobe there is a 1.1 x 0.9 x 1.1 cm cyst. IMPRESSION: No cholelithiasis or sonographic evidence for acute cholecystitis. Common bile duct is prominent for age measuring up to 8 mm. In the setting of abnormal LFTs, consider further evaluation with MRCP as clinically indicated. Hepatic steatosis. Electronically Signed   By: Annia Belt M.D.   On: 02/18/2016 20:20       Scheduled Meds: . levothyroxine  100 mcg Oral QAC breakfast  . mirtazapine  15 mg Oral QHS  . piperacillin-tazobactam (ZOSYN)  IV  3.375 g Intravenous Q8H  . sodium chloride flush  3 mL Intravenous Q12H   Continuous Infusions: . sodium chloride 100 mL/hr at 02/19/16 0805     LOS: 1 day    Time spent: 35 min    Jeralyn Bennett, MD [facility] Pager [phone]  If 7PM-7AM, please contact night-coverage [email or web address] Password [facility] 02/19/2016, 10:13 AM

## 2016-02-19 NOTE — Progress Notes (Signed)
PHARMACY - PHYSICIAN COMMUNICATION CRITICAL VALUE ALERT - BLOOD CULTURE IDENTIFICATION (BCID)  Results for orders placed or performed during the hospital encounter of 02/18/16  Blood Culture ID Panel (Reflexed) (Collected: 02/18/2016  1:16 PM)  Result Value Ref Range   Enterococcus species NOT DETECTED NOT DETECTED   Vancomycin resistance NOT DETECTED NOT DETECTED   Listeria monocytogenes NOT DETECTED NOT DETECTED   Staphylococcus species NOT DETECTED NOT DETECTED   Staphylococcus aureus NOT DETECTED NOT DETECTED   Methicillin resistance NOT DETECTED NOT DETECTED   Streptococcus species NOT DETECTED NOT DETECTED   Streptococcus agalactiae NOT DETECTED NOT DETECTED   Streptococcus pneumoniae NOT DETECTED NOT DETECTED   Streptococcus pyogenes NOT DETECTED NOT DETECTED   Acinetobacter baumannii NOT DETECTED NOT DETECTED   Enterobacteriaceae species DETECTED (A) NOT DETECTED   Enterobacter cloacae complex NOT DETECTED NOT DETECTED   Escherichia coli NOT DETECTED NOT DETECTED   Klebsiella oxytoca DETECTED (A) NOT DETECTED   Klebsiella pneumoniae NOT DETECTED NOT DETECTED   Proteus species NOT DETECTED NOT DETECTED   Serratia marcescens NOT DETECTED NOT DETECTED   Carbapenem resistance NOT DETECTED NOT DETECTED   Haemophilus influenzae NOT DETECTED NOT DETECTED   Neisseria meningitidis NOT DETECTED NOT DETECTED   Pseudomonas aeruginosa NOT DETECTED NOT DETECTED   Candida albicans NOT DETECTED NOT DETECTED   Candida glabrata NOT DETECTED NOT DETECTED   Candida krusei NOT DETECTED NOT DETECTED   Candida parapsilosis NOT DETECTED NOT DETECTED   Candida tropicalis NOT DETECTED NOT DETECTED   82 year old female admitted with suspected sepsis. No source identified, started on Vancomycin and Zosyn empirically.  Now with 1/2 Blood cultures with Gram negative rods, Klebsiella oxytoca identified on BCID.  BCID algorithm recommends narrowing to Ceftriaxone pending sensitivities.   Name of physician  (or Provider) Contacted: Dr. Vanessa Barbara  Changes to prescribed antibiotics required: stop Vancomycin. Continue Zosyn pending identification of source.  Sallee Provencal 02/19/2016  9:52 AM

## 2016-02-19 NOTE — Care Management Important Message (Signed)
Important Message  Patient Details  Name: Taylor Bass MRN: [id number] Date of Birth: [birth year]   Medicare Important Message Given:  Yes    Bernadette Hoit 02/19/2016, 8:42 AM

## 2016-02-20 ENCOUNTER — Inpatient Hospital Stay (HOSPITAL_COMMUNITY): Payer: Medicare Other

## 2016-02-20 DIAGNOSIS — R7881 Bacteremia: Secondary | ICD-10-CM

## 2016-02-20 DIAGNOSIS — D696 Thrombocytopenia, unspecified: Secondary | ICD-10-CM

## 2016-02-20 DIAGNOSIS — K8050 Calculus of bile duct without cholangitis or cholecystitis without obstruction: Secondary | ICD-10-CM | POA: Diagnosis present

## 2016-02-20 LAB — COMPREHENSIVE METABOLIC PANEL
ALT: 116 U/L — AB (ref 14–54)
AST: 97 U/L — AB (ref 15–41)
Albumin: 2.2 g/dL — ABNORMAL LOW (ref 3.5–5.0)
Alkaline Phosphatase: 86 U/L (ref 38–126)
Anion gap: 5 (ref 5–15)
BUN: 10 mg/dL (ref 6–20)
CHLORIDE: 111 mmol/L (ref 101–111)
CO2: 24 mmol/L (ref 22–32)
CREATININE: 0.77 mg/dL (ref 0.44–1.00)
Calcium: 8.1 mg/dL — ABNORMAL LOW (ref 8.9–10.3)
GFR calc Af Amer: >60 mL/min (ref >60)
GFR calc non Af Amer: >60 mL/min (ref >60)
Glucose, Bld: 82 mg/dL (ref 65–99)
Potassium: 3.8 mmol/L (ref 3.5–5.1)
SODIUM: 140 mmol/L (ref 135–145)
Total Bilirubin: 0.4 mg/dL (ref 0.3–1.2)
Total Protein: 6 g/dL — ABNORMAL LOW (ref 6.5–8.1)

## 2016-02-20 LAB — ECHOCARDIOGRAM COMPLETE
CHL CUP DOP CALC LVOT VTI: 25.5 cm
CHL CUP MV DEC (S): 239
CHL CUP RV SYS PRESS: 25 mmHg
CHL CUP TV REG PEAK VELOCITY: 232 cm/s
E decel time: 239 msec
E/e' ratio: 15.47
FS: 25 % — AB (ref 28–44)
Height: 65 in
IVS/LV PW RATIO, ED: 1.24
LA ID, A-P, ES: 27 mm
LA diam end sys: 27 mm
LA diam index: 1.66 cm/m2
LA vol A4C: 29.3 ml
LA vol index: 17 mL/m2
LA vol: 27.6 mL
LV PW d: 12.5 mm — AB (ref 0.6–1.1)
LV TDI E'LATERAL: 6.53
LV TDI E'MEDIAL: 6.85
LVEEAVG: 15.47
LVEEMED: 15.47
LVELAT: 6.53 cm/s
LVOT area: 3.14 cm2
LVOTD: 20 mm
LVOTPV: 93.2 cm/s
LVOTSV: 80 mL
MV pk A vel: 100 m/s
MVPG: 4 mmHg
MVPKEVEL: 101 m/s
RV TAPSE: 23.4 mm
TR max vel: 232 cm/s
Weight: 2035.2 oz

## 2016-02-20 LAB — CBC
HCT: 33.4 % — ABNORMAL LOW (ref 36.0–46.0)
Hemoglobin: 10.4 g/dL — ABNORMAL LOW (ref 12.0–15.0)
MCH: 29.7 pg (ref 26.0–34.0)
MCHC: 31.1 g/dL (ref 30.0–36.0)
MCV: 95.4 fL (ref 78.0–100.0)
PLATELETS: 82 10*3/uL — AB (ref 150–400)
RBC: 3.5 MIL/uL — ABNORMAL LOW (ref 3.87–5.11)
RDW: 14.8 % (ref 11.5–15.5)
WBC: 5.9 10*3/uL (ref 4.0–10.5)

## 2016-02-20 LAB — LIPASE, BLOOD: Lipase: 50 U/L (ref 11–51)

## 2016-02-20 MED ORDER — DEXTROSE 5 % IV SOLN
2.0000 g | INTRAVENOUS | Status: DC
  Administered 2016-02-20 – 2016-02-23 (×4): 2 g via INTRAVENOUS
  Filled 2016-02-20 (×5): qty 2

## 2016-02-20 NOTE — Progress Notes (Signed)
PROGRESS NOTE    Taylor Bass  ZOX:[id number] DOB: [birth year] DOA: 02/18/2016 PCP: Jackie Plum, MD   Brief Narrative:  Taylor Bass is a pleasant 82 year old female with a past medical history of hypertension, thrombus cytopenia, admitted to the medicine service on 02/18/2016 with complaints of fevers, chills, found to be febrile with temperature 102, hypotensive with systolic blood pressures in the 70s and 80s and a lactic acid of 4.5. Initial workup did not show obvious source of infection as chest x-ray and urinalysis were unremarkable. She was started on broad-spectrum IV antibiotic therapy with vancomycin and Zosyn. On the following day microbiology reported one out of 2 blood cultures positive for gram-negative rods. Lab work revealed elevated transaminases that was further worked up with right upper quadrant ultrasound. Did not show evidence of acute cholecystitis however there was, bile duct dilatation to 8 mm. This was further worked up with MRCP given no other clear source of infection.    Assessment & Plan:   Principal Problem:   Sepsis (HCC) Active Problems:   Elevated AST (SGOT)   Elevated ALT measurement   Thrombocytopenia (HCC)   Fever   Lactic acidosis   Tobacco abuse   Hypotension   Hypothyroidism   1.  Severe Sepsis -Present on admission, evidenced by hypotension with systolic blood pressures in the 80s, elevated lactic acid level of 4.5, temperature 102.4, heart rate of 101 with blood cultures growing gram-negative rods -Source of infection is unclear, this for workup has included chest x-ray and urinalysis which were unrevealing. -Having elevated transaminases she had a right upper quadrant ultrasound that did not show evidence for acute cholecystitis. Radiology did reports dilated common bile duct measuring up to 8 mm. Having no other explanation for source of infection, will further workup with MRCP.  -Meanwhile will continue IV fluid resuscitation, narrow  antibiotic therapy to Zosyn with discontinuation of vancomycin -Will check a transthoracic echocardiogram -Lactic acid levels trended down to 1.4 -Microbiology identifying organism as Klebsiella.  -Will narrow his antibiotic regimen to ceftriaxone and discontinue Zosyn  2.  Elevated transaminases -Lab work showing elevated AST of 213 with ALT of 172. Her total bilirubin was normal at 0.6 and Alkaline Phosphatase was 96. This data does not quite fit the cholestatic liver disease pattern. Elevated transaminases could be related to shock liver from sepsis. -However without having identified source of infection, will further workup with MRCP -On 02/20/2016 lab work showing downward trend in transaminases -Continue IV fluids, supportive care, repeat CMP in a.m.  3.  History of thrombocytopenia -Since 2013 platelet count has fluctuated between 90 and 151  4.  History of hypertension -She had been on Hyzaar in the outpatient setting which was discontinued due to sepsis and hypotension  5.  Hypothyroidism -Continue Synthroid 100 g by mouth daily  -Labs showed TSH of 0.676   DVT prophylaxis: SCD's  Code Status: Full Code Family Communication: I spoke with her son Harvie Heck [phone] on 02/20/2016, updated him on patient's condition Disposition Plan: Transfer out of stepdown unit to telemetry  Consultants:     Procedures:     Antimicrobials:   Zosyn started on 02/18/2016 discontinued on 02/20/2016  Vancomycin started on 02/18/2016 and discontinued on 02/19/2016  Ceftriaxone started on 02/20/2016   Subjective: She states doing well, tolerating by mouth, denies fevers, chills, has no complaints  Objective: Vitals:   02/19/16 1953 02/19/16 2351 02/20/16 0424 02/20/16 0803  BP: 136/64  (!) 117/57 (!) 147/78  Pulse:  (!) 58  Resp: 20 16 17 17   Temp: 98 F (36.7 C) 98.2 F (36.8 C) 97.8 F (36.6 C) 98 F (36.7 C)  TempSrc: Oral Oral Oral Oral  SpO2: 100% 98%    Weight:    57.7 kg (127 lb 3.2 oz)   Height:        Intake/Output Summary (Last 24 hours) at 02/20/16 0816 Last data filed at 02/20/16 0000  Gross per 24 hour  Intake          1931.67 ml  Output              750 ml  Net          1181.67 ml   Filed Weights   02/18/16 1354 02/20/16 0424  Weight: 51 kg (112 lb 7 oz) 57.7 kg (127 lb 3.2 oz)    Examination:  General exam: Awake and alert, answering questions appropriately, she is nontoxic appearing Respiratory system: Clear to auscultation. Respiratory effort normal. Cardiovascular system:S1 & S2 heard, RRR. No JVD, murmurs, rubs, gallops or clicks. No pedal edema. Gastrointestinal system: For abdominal exam was benign, noting she did not have pain to palpation involving right upper quadrant. Central nervous system: Alert and oriented. No focal neurological deficits. Extremities: Symmetric 5 x 5 power. Skin: No rashes, lesions or ulcers Psychiatry: Judgement and insight appear normal. Mood & affect appropriate.     Data Reviewed: I have personally reviewed following labs and imaging studies  CBC:  Recent Labs Lab 02/18/16 1319 02/19/16 0247 02/20/16 0224  WBC 9.1 7.9 5.9  NEUTROABS 7.5  --   --   HGB 12.6 10.1* 10.4*  HCT 39.0 32.5* 33.4*  MCV 95.6 95.6 95.4  PLT 108* 90* 82*   Basic Metabolic Panel:  Recent Labs Lab 02/18/16 1319 02/19/16 0247 02/20/16 0224  NA 137 138 140  K 3.9 3.6 3.8  CL 104 108 111  CO2 25 27 24   GLUCOSE 108* 83 82  BUN 21* 15 10  CREATININE 0.96 0.91 0.77  CALCIUM 9.2 8.1* 8.1*   GFR: Estimated Creatinine Clearance: 51.3 mL/min (by C-G formula based on SCr of 0.8 mg/dL). Liver Function Tests:  Recent Labs Lab 02/18/16 1319 02/19/16 0247 02/20/16 0224  AST 232* 213* 97*  ALT 128* 172* 116*  ALKPHOS 126 96 86  BILITOT 0.6 0.6 0.4  PROT 8.3* 6.1* 6.0*  ALBUMIN 3.2* 2.3* 2.2*   No results for input(s): LIPASE, AMYLASE in the last 168 hours. No results for input(s): AMMONIA in the last  168 hours. Coagulation Profile:  Recent Labs Lab 02/18/16 1733  INR 1.22   Cardiac Enzymes: No results for input(s): CKTOTAL, CKMB, CKMBINDEX, TROPONINI in the last 168 hours. BNP (last 3 results) No results for input(s): PROBNP in the last 8760 hours. HbA1C: No results for input(s): HGBA1C in the last 72 hours. CBG:  Recent Labs Lab 02/19/16 1512  GLUCAP 83   Lipid Profile: No results for input(s): CHOL, HDL, LDLCALC, TRIG, CHOLHDL, LDLDIRECT in the last 72 hours. Thyroid Function Tests:  Recent Labs  02/18/16 1733  TSH 0.676   Anemia Panel: No results for input(s): VITAMINB12, FOLATE, FERRITIN, TIBC, IRON, RETICCTPCT in the last 72 hours. Sepsis Labs:  Recent Labs Lab 02/18/16 1332 02/18/16 1733 02/18/16 1735 02/18/16 2035  PROCALCITON  --  2.42  --   --   LATICACIDVEN 4.53*  --  2.3* 1.4    Recent Results (from the past 240 hour(s))  Urine culture     Status: Abnormal  Collection Time: 02/18/16  1:05 PM  Result Value Ref Range Status   Specimen Description URINE, RANDOM  Final   Special Requests NONE  Final   Culture MULTIPLE SPECIES PRESENT, SUGGEST RECOLLECTION (A)  Final   Report Status 02/19/2016 FINAL  Final  Culture, blood (Routine x 2)     Status: None (Preliminary result)   Collection Time: 02/18/16  1:16 PM  Result Value Ref Range Status   Specimen Description BLOOD RIGHT ANTECUBITAL  Final   Special Requests BOTTLES DRAWN AEROBIC AND ANAEROBIC  10CC  Final   Culture  Setup Time   Final    GRAM NEGATIVE RODS AEROBIC BOTTLE ONLY CRITICAL RESULT CALLED TO, READ BACK BY AND VERIFIED WITH: Artelia Laroche PHARMD AT 6045 02/19/16 BY D. VANHOOK    Culture   Final    GRAM NEGATIVE RODS CULTURE REINCUBATED FOR BETTER GROWTH    Report Status PENDING  Incomplete  Blood Culture ID Panel (Reflexed)     Status: Abnormal   Collection Time: 02/18/16  1:16 PM  Result Value Ref Range Status   Enterococcus species NOT DETECTED NOT DETECTED Final   Vancomycin  resistance NOT DETECTED NOT DETECTED Final   Listeria monocytogenes NOT DETECTED NOT DETECTED Final   Staphylococcus species NOT DETECTED NOT DETECTED Final   Staphylococcus aureus NOT DETECTED NOT DETECTED Final   Methicillin resistance NOT DETECTED NOT DETECTED Final   Streptococcus species NOT DETECTED NOT DETECTED Final   Streptococcus agalactiae NOT DETECTED NOT DETECTED Final   Streptococcus pneumoniae NOT DETECTED NOT DETECTED Final   Streptococcus pyogenes NOT DETECTED NOT DETECTED Final   Acinetobacter baumannii NOT DETECTED NOT DETECTED Final   Enterobacteriaceae species DETECTED (A) NOT DETECTED Final    Comment: CRITICAL RESULT CALLED TO, READ BACK BY AND VERIFIED WITH: Artelia Laroche PHARMD AT 0925 02/19/16 BY D. VANHOOK    Enterobacter cloacae complex NOT DETECTED NOT DETECTED Final   Escherichia coli NOT DETECTED NOT DETECTED Final   Klebsiella oxytoca DETECTED (A) NOT DETECTED Final    Comment: CRITICAL RESULT CALLED TO, READ BACK BY AND VERIFIED WITH: Artelia Laroche PHARMD AT 0925 02/19/16 BY D. VANHOOK    Klebsiella pneumoniae NOT DETECTED NOT DETECTED Final   Proteus species NOT DETECTED NOT DETECTED Final   Serratia marcescens NOT DETECTED NOT DETECTED Final   Carbapenem resistance NOT DETECTED NOT DETECTED Final   Haemophilus influenzae NOT DETECTED NOT DETECTED Final   Neisseria meningitidis NOT DETECTED NOT DETECTED Final   Pseudomonas aeruginosa NOT DETECTED NOT DETECTED Final   Candida albicans NOT DETECTED NOT DETECTED Final   Candida glabrata NOT DETECTED NOT DETECTED Final   Candida krusei NOT DETECTED NOT DETECTED Final   Candida parapsilosis NOT DETECTED NOT DETECTED Final   Candida tropicalis NOT DETECTED NOT DETECTED Final  Culture, blood (Routine x 2)     Status: None (Preliminary result)   Collection Time: 02/18/16  1:45 PM  Result Value Ref Range Status   Specimen Description BLOOD LEFT ANTECUBITAL  Final   Special Requests BOTTLES DRAWN AEROBIC AND  ANAEROBIC  5CC  Final   Culture NO GROWTH < 24 HOURS  Final   Report Status PENDING  Incomplete  MRSA PCR Screening     Status: None   Collection Time: 02/18/16  6:50 PM  Result Value Ref Range Status   MRSA by PCR NEGATIVE NEGATIVE Final    Comment:        The GeneXpert MRSA Assay (FDA approved for NASAL specimens only), is  one component of a comprehensive MRSA colonization surveillance program. It is not intended to diagnose MRSA infection nor to guide or monitor treatment for MRSA infections.          Radiology Studies: Dg Chest 2 View  Result Date: 02/18/2016 CLINICAL DATA:  Healing bed, fever, chills EXAM: CHEST  2 VIEW COMPARISON:  10/09/2011 FINDINGS: Lungs are clear.  No pleural effusion or pneumothorax. The heart is normal in size. Mild degenerative changes of the visualized thoracolumbar spine. IMPRESSION: No evidence of acute cardiopulmonary disease. Electronically Signed   By: Charline Bills M.D.   On: 02/18/2016 15:02  Mr Abdomen Mrcp Wo Cm  Result Date: 02/20/2016 CLINICAL DATA:  Cholecystitis, preop laparoscopic cholecystectomy, evaluate for CBD stone EXAM: MRI ABDOMEN WITHOUT CONTRAST  (INCLUDING MRCP) TECHNIQUE: Multiplanar multisequence MR imaging of the abdomen was performed. Heavily T2-weighted images of the biliary and pancreatic ducts were obtained, and three-dimensional MRCP images were rendered by post processing. COMPARISON:  Right upper quadrant ultrasound dated 02/18/2016 FINDINGS: Severely motion degraded images. Lower chest:  Lung bases are clear. Hepatobiliary: 10 mm cyst inferiorly in segment 6 (series 3/ image 22). No suspicious hepatic lesions. Layering small gallstones (series 3/ image 26), without associated gallbladder wall thickening or inflammatory changes. Mild central intrahepatic ductal dilatation. Dilated common duct, measuring 10 mm (series 11/image 28). Stacked small distal CBD stones (series 5/ image 16), poorly visualized due motion  degradation. Pancreas: Mild peripancreatic fluid along the pancreatic tail (series 3/image 18), nonspecific. Correlate for acute pancreatitis. No associated pancreatic ductal dilatation. Spleen: Within normal limits. Adrenals/Urinary Tract: Adrenal glands are within normal limits. Kidneys are within normal limits, noting nonspecific perinephric fluid bilaterally. No hydronephrosis. Stomach/Bowel: Stomach is within normal limits. Visualized bowel is unremarkable. Vascular/Lymphatic: No evidence of abdominal aortic aneurysm. No suspicious abdominal lymphadenopathy. Other: No abdominal ascites. Musculoskeletal: No focal osseous lesions. IMPRESSION: Severely motion degraded images. Cholelithiasis, without associated inflammatory changes to suggest acute cholecystitis. Mild intrahepatic and extrahepatic ductal dilatation. Common duct measures 10 mm. Choledocholithiasis, with small distal CBD stones. ERCP is suggested. Mild peripancreatic fluid along the pancreatic tail, nonspecific. Correlate for acute pancreatitis. Electronically Signed   By: Charline Bills M.D.   On: 02/20/2016 07:43  US Abdomen Limited Ruq  Result Date: 02/18/2016 CLINICAL DATA:  Patient with elevated LFTs. EXAM: US ABDOMEN LIMITED - RIGHT UPPER QUADRANT COMPARISON:  None. FINDINGS: Gallbladder: No gallstones or wall thickening visualized. No sonographic Murphy sign noted by sonographer. Common bile duct: Diameter: 8 mm Liver: Liver is increased in echogenicity. Within the left hepatic lobe there is a 1.1 x 0.9 x 1.1 cm cyst. IMPRESSION: No cholelithiasis or sonographic evidence for acute cholecystitis. Common bile duct is prominent for age measuring up to 8 mm. In the setting of abnormal LFTs, consider further evaluation with MRCP as clinically indicated. Hepatic steatosis. Electronically Signed   By: Annia Belt M.D.   On: 02/18/2016 20:20       Scheduled Meds: . feeding supplement (ENSURE ENLIVE)  237 mL Oral BID BM  . levothyroxine   100 mcg Oral QAC breakfast  . mirtazapine  15 mg Oral QHS  . piperacillin-tazobactam (ZOSYN)  IV  3.375 g Intravenous Q8H  . sodium chloride flush  3 mL Intravenous Q12H   Continuous Infusions: . sodium chloride 100 mL/hr at 02/19/16 0805     LOS: 2 days    Time spent: 35 min    Jeralyn Bennett, MD [facility] Pager [phone]  If 7PM-7AM, please contact night-coverage [email or web address]  Password [email or web address] 02/20/2016, 8:16 AM

## 2016-02-20 NOTE — Consult Note (Signed)
Referring Provider: Dr. Vanessa Barbara Primary Care Physician:  Jackie Plum, MD Primary Gastroenterologist:  Gentry Fitz  Reason for Consultation:  Choledocholithiasis; Elevated Liver Enzymes  HPI: Taylor Bass is a 82 y.o. female seen for a consult due to choledocholithiasis seen on MRCP. Patient denies abdominal pain, N/V. She is oriented to person and place but not time and no family in room and unable to reach son or daughter by phone at this time. On admit (02/18/16) TB 0.6, ALP 126, AST 232, ALT 128. Today TB 0.4, ALP 86, AST 97, ALT 116. She had a fever of 102 on admit, chills, and hypotension. Negative UA, CXR. U/S showed CBD 8 mm without gallstones or cholecystitis. MRCP showed CBD 1 cm with small distal CBD stones and mild peripancreatic fluid at the tail. Lipase not checked on admit but 50 today. History of alcohol abuse but son reports no alcohol in years per chart review.    Past Medical History:  Diagnosis Date  . Hypertension   . Hypothyroidism   . Sepsis (HCC)   . Thrombocytopenia (HCC)   . Tobacco use     History reviewed. No pertinent surgical history.  Prior to Admission medications   Medication Sig Start Date End Date Taking? Authorizing Provider  atorvastatin (LIPITOR) 80 MG tablet Take 80 mg by mouth every evening. 01/18/16  Yes Historical Provider, MD  levothyroxine (SYNTHROID, LEVOTHROID) 100 MCG tablet Take 100 mcg by mouth daily. 01/23/16  Yes Historical Provider, MD  losartan-hydrochlorothiazide (HYZAAR) 100-25 MG per tablet Take 1 tablet by mouth daily.   Yes Historical Provider, MD  mirtazapine (REMERON) 15 MG tablet Take 15 mg by mouth at bedtime. 01/18/16  Yes Historical Provider, MD  potassium chloride (K-DUR) 10 MEQ tablet Take 10 mEq by mouth daily. 01/18/16  Yes Historical Provider, MD  PROAIR HFA 108 (90 Base) MCG/ACT inhaler Inhale 2 puffs into the lungs 4 (four) times daily as needed for wheezing or shortness of breath. 01/18/16  Yes Historical Provider,  MD  urea 10 % lotion Apply 1 application topically 2 (two) times daily. 01/19/16  Yes Historical Provider, MD  diphenhydrAMINE (BENADRYL) 25 MG tablet Take 1 tablet (25 mg total) by mouth every 6 (six) hours as needed for itching. X Patient not taking: Reported on 02/18/2016 05/22/12   Trixie Dredge, PA-C    Scheduled Meds: . cefTRIAXone (ROCEPHIN)  IV  2 g Intravenous Q24H  . feeding supplement (ENSURE ENLIVE)  237 mL Oral BID BM  . levothyroxine  100 mcg Oral QAC breakfast  . mirtazapine  15 mg Oral QHS  . sodium chloride flush  3 mL Intravenous Q12H   Continuous Infusions:  PRN Meds:.acetaminophen **OR** acetaminophen, albuterol, ondansetron **OR** ondansetron (ZOFRAN) IV  Allergies as of 02/18/2016  . (No Known Allergies)    Family History  Problem Relation Age of Onset  . CAD Father   . Hypertension Sister     Social History   Social History  . Marital status: Single    Spouse name: N/A  . Number of children: N/A  . Years of education: N/A   Occupational History  . Not on file.   Social History Main Topics  . Smoking status: Current Every Day Smoker    Packs/day: 1.00    Types: Cigarettes  . Smokeless tobacco: Never Used  . Alcohol use No  . Drug use: No  . Sexual activity: Not on file   Other Topics Concern  . Not on file   Social History Narrative   **  Merged History Encounter **        Review of Systems: All negative except as stated above in HPI.  Physical Exam: Vital signs: Vitals:   02/20/16 1117 02/20/16 1534  BP: 115/70 (!) 115/50  Pulse: (!) 58 67  Resp: 18 16  Temp: 98 F (36.7 C) 97.5 F (36.4 C)   Last BM Date: 02/19/16 General:  Lethargic, elderly, thin, no acute distress HEENT: anicteric sclera, oropharynx clear Neck: supple, nontender Lungs:  Clear throughout to auscultation.   No wheezes, crackles, or rhonchi. No acute distress. Heart:  Regular rate and rhythm; no murmurs, clicks, rubs,  or gallops. Abdomen: RUQ and epigastric  tenderness with guarding, soft, nondistended, +BS  Rectal:  Deferred Ext: no edema Neuro: oriented to person and place not time  GI:  Lab Results:  Recent Labs  02/18/16 1319 02/19/16 0247 02/20/16 0224  WBC 9.1 7.9 5.9  HGB 12.6 10.1* 10.4*  HCT 39.0 32.5* 33.4*  PLT 108* 90* 82*   BMET  Recent Labs  02/18/16 1319 02/19/16 0247 02/20/16 0224  NA 137 138 140  K 3.9 3.6 3.8  CL 104 108 111  CO2 25 27 24   GLUCOSE 108* 83 82  BUN 21* 15 10  CREATININE 0.96 0.91 0.77  CALCIUM 9.2 8.1* 8.1*   LFT  Recent Labs  02/20/16 0224  PROT 6.0*  ALBUMIN 2.2*  AST 97*  ALT 116*  ALKPHOS 86  BILITOT 0.4   PT/INR  Recent Labs  02/18/16 1733  LABPROT 15.6*  INR 1.22     Studies/Results: Mr Abdomen Mrcp Wo Cm  Result Date: 02/20/2016 CLINICAL DATA:  Cholecystitis, preop laparoscopic cholecystectomy, evaluate for CBD stone EXAM: MRI ABDOMEN WITHOUT CONTRAST  (INCLUDING MRCP) TECHNIQUE: Multiplanar multisequence MR imaging of the abdomen was performed. Heavily T2-weighted images of the biliary and pancreatic ducts were obtained, and three-dimensional MRCP images were rendered by post processing. COMPARISON:  Right upper quadrant ultrasound dated 02/18/2016 FINDINGS: Severely motion degraded images. Lower chest:  Lung bases are clear. Hepatobiliary: 10 mm cyst inferiorly in segment 6 (series 3/ image 22). No suspicious hepatic lesions. Layering small gallstones (series 3/ image 26), without associated gallbladder wall thickening or inflammatory changes. Mild central intrahepatic ductal dilatation. Dilated common duct, measuring 10 mm (series 11/image 28). Stacked small distal CBD stones (series 5/ image 16), poorly visualized due motion degradation. Pancreas: Mild peripancreatic fluid along the pancreatic tail (series 3/image 18), nonspecific. Correlate for acute pancreatitis. No associated pancreatic ductal dilatation. Spleen: Within normal limits. Adrenals/Urinary Tract: Adrenal  glands are within normal limits. Kidneys are within normal limits, noting nonspecific perinephric fluid bilaterally. No hydronephrosis. Stomach/Bowel: Stomach is within normal limits. Visualized bowel is unremarkable. Vascular/Lymphatic: No evidence of abdominal aortic aneurysm. No suspicious abdominal lymphadenopathy. Other: No abdominal ascites. Musculoskeletal: No focal osseous lesions. IMPRESSION: Severely motion degraded images. Cholelithiasis, without associated inflammatory changes to suggest acute cholecystitis. Mild intrahepatic and extrahepatic ductal dilatation. Common duct measures 10 mm. Choledocholithiasis, with small distal CBD stones. ERCP is suggested. Mild peripancreatic fluid along the pancreatic tail, nonspecific. Correlate for acute pancreatitis. Electronically Signed   By: Charline Bills M.D.   On: 02/20/2016 07:43  US Abdomen Limited Ruq  Result Date: 02/18/2016 CLINICAL DATA:  Patient with elevated LFTs. EXAM: US ABDOMEN LIMITED - RIGHT UPPER QUADRANT COMPARISON:  None. FINDINGS: Gallbladder: No gallstones or wall thickening visualized. No sonographic Murphy sign noted by sonographer. Common bile duct: Diameter: 8 mm Liver: Liver is increased in echogenicity. Within the left  hepatic lobe there is a 1.1 x 0.9 x 1.1 cm cyst. IMPRESSION: No cholelithiasis or sonographic evidence for acute cholecystitis. Common bile duct is prominent for age measuring up to 8 mm. In the setting of abnormal LFTs, consider further evaluation with MRCP as clinically indicated. Hepatic steatosis. Electronically Signed   By: Annia Belt M.D.   On: 02/18/2016 20:20   Impression/Plan: Choledocholithiasis - small non-obstructing but with presentation of fevers, chills and no other source identified. Patient is tender on exam and history is limited by her age and alcohol abuse history. Clinically no sign of pancreatitis but increased risk with CBD stones noted on MRCP. Would recommend ERCP if family is  agreeable since she is symptomatic. Unable to reach family right now and will retry tomorrow. ERCP tentatively planned for Thursday 02/22/16.    LOS: 2 days   Nashon Erbes C.  02/20/2016, 5:48 PM  Pager [phone]  If no answer or after 5 PM call [phone]

## 2016-02-20 NOTE — Progress Notes (Signed)
Pt to transfer to telemetry unit from stepdown, new room will be 6E12. Attempted to call report, receiving nurse will call back.

## 2016-02-20 NOTE — Progress Notes (Signed)
Report completed to RN [facility] for transfer to room [phone]. Pt having 2D echo at bedside at this time, will transfer pt after echo complete.

## 2016-02-20 NOTE — Progress Notes (Addendum)
Contacted Daughter Velna Hatchet and informed her of her mother's new room number--[id number], pt transferring there in next few minutes, (pt echo completed and pt has ate lunch).

## 2016-02-20 NOTE — Progress Notes (Signed)
  Echocardiogram 2D Echocardiogram has been performed.  Taylor Bass 02/20/2016, 1:29 PM

## 2016-02-20 NOTE — Progress Notes (Signed)
Follow-up note   MRCP showing common bile duct measuring 10 mm with presence of choledocholithiasis with small distal common bile duct stones. [facility] consulted for ERCP, Dr Bosie Clos paged.

## 2016-02-21 LAB — CULTURE, BLOOD (ROUTINE X 2)

## 2016-02-21 LAB — CBC
HCT: 35 % — ABNORMAL LOW (ref 36.0–46.0)
HEMOGLOBIN: 11 g/dL — AB (ref 12.0–15.0)
MCH: 29.9 pg (ref 26.0–34.0)
MCHC: 31.4 g/dL (ref 30.0–36.0)
MCV: 95.1 fL (ref 78.0–100.0)
Platelets: 96 10*3/uL — ABNORMAL LOW (ref 150–400)
RBC: 3.68 MIL/uL — AB (ref 3.87–5.11)
RDW: 14.8 % (ref 11.5–15.5)
WBC: 6.8 10*3/uL (ref 4.0–10.5)

## 2016-02-21 LAB — COMPREHENSIVE METABOLIC PANEL
ALT: 90 U/L — ABNORMAL HIGH (ref 14–54)
ANION GAP: 4 — AB (ref 5–15)
AST: 60 U/L — ABNORMAL HIGH (ref 15–41)
Albumin: 2.4 g/dL — ABNORMAL LOW (ref 3.5–5.0)
Alkaline Phosphatase: 97 U/L (ref 38–126)
BUN: 11 mg/dL (ref 6–20)
CHLORIDE: 106 mmol/L (ref 101–111)
CO2: 28 mmol/L (ref 22–32)
Calcium: 8.6 mg/dL — ABNORMAL LOW (ref 8.9–10.3)
Creatinine, Ser: 0.86 mg/dL (ref 0.44–1.00)
GFR calc non Af Amer: >60 mL/min (ref >60)
Glucose, Bld: 89 mg/dL (ref 65–99)
POTASSIUM: 4 mmol/L (ref 3.5–5.1)
SODIUM: 138 mmol/L (ref 135–145)
Total Bilirubin: 0.4 mg/dL (ref 0.3–1.2)
Total Protein: 6.5 g/dL (ref 6.5–8.1)

## 2016-02-21 MED ORDER — NICOTINE 14 MG/24HR TD PT24
14.0000 mg | MEDICATED_PATCH | Freq: Every day | TRANSDERMAL | Status: DC
  Administered 2016-02-21 – 2016-02-24 (×4): 14 mg via TRANSDERMAL
  Filled 2016-02-21 (×4): qty 1

## 2016-02-21 MED ORDER — SODIUM CHLORIDE 0.9 % IV SOLN
INTRAVENOUS | Status: DC
  Administered 2016-02-21 – 2016-02-23 (×3): via INTRAVENOUS

## 2016-02-21 NOTE — Progress Notes (Signed)
Physical Therapy Treatment Patient Details Name: Taylor Bass MRN: [id number] DOB: Sep 20, 1933 Today's Date: 02/21/2016    History of Present Illness Pt is a 82 y/o F admitted on 7/23 with c/o fevers, chills, found to be febrile with temperature of 102, hypotensive. Admitted for sepsis.  No clear source of infection.     PT Comments    Patient progressing well towards PT goals. Continues to require min A for balance during gait training due to shuffling like gait pattern and variable speeds. Plan for stair training next session as tolerated. Encouraged OOB to chair for all meals. Will follow acutely to maximize independence, safety and mobility.   Follow Up Recommendations  Home health PT;Supervision/Assistance - 24 hour     Equipment Recommendations  None recommended by PT    Recommendations for Other Services       Precautions / Restrictions Precautions Precautions: Fall Restrictions Weight Bearing Restrictions: No    Mobility  Bed Mobility Overal bed mobility: Needs Assistance Bed Mobility: Supine to Sit     Supine to sit: Supervision;HOB elevated     General bed mobility comments: No assist needed. Increased time and use of rail.   Transfers Overall transfer level: Needs assistance Equipment used: Quad cane Transfers: Sit to/from Stand Sit to Stand: Min guard;Min assist         General transfer comment: Min guard for safety to stand from EOB and toilet using grab bar. Transferred to chair post ambulation bout.  Ambulation/Gait Ambulation/Gait assistance: Min assist Ambulation Distance (Feet): 140 Feet Assistive device: Quad cane Gait Pattern/deviations: Step-through pattern;Decreased stride length;Shuffle;Trunk flexed Gait velocity: variable speeds   General Gait Details: Flexed posture with variable gait speeds. Using quad cane but reaching for furniture in room for support. Min A for balance esp when shuffling and when gait speed  increases.   Stairs            Wheelchair Mobility    Modified Rankin (Stroke Patients Only)       Balance Overall balance assessment: Needs assistance Sitting-balance support: Feet supported;No upper extremity supported Sitting balance-Leahy Scale: Good     Standing balance support: During functional activity Standing balance-Leahy Scale: Fair Standing balance comment: Able to stand at sink and wash hands reaching outside BoS to grab towel  without UE support.                    Cognition Arousal/Alertness: Awake/alert Behavior During Therapy: Flat affect Overall Cognitive Status: Within Functional Limits for tasks assessed                      Exercises      General Comments        Pertinent Vitals/Pain Pain Assessment: No/denies pain    Home Living                      Prior Function            PT Goals (current goals can now be found in the care plan section) Progress towards PT goals: Progressing toward goals    Frequency  Min 3X/week    PT Plan Current plan remains appropriate    Co-evaluation             End of Session Equipment Utilized During Treatment: Gait belt Activity Tolerance: Patient tolerated treatment well Patient left: in chair;with call bell/phone within reach;with chair alarm set     Time: 0943-1000 PT Time  Calculation (min) (ACUTE ONLY): 17 min  Charges:  $Gait Training: 8-22 mins                    G Codes:      Caroleena Paolini A Derrick Orris 02/21/2016, 10:32 AM Mylo Red, PT, DPT [phone]

## 2016-02-21 NOTE — Progress Notes (Signed)
[facility] Progress Note  Subjective: Patient denies any abdominal pain nausea or chills  Objective: Vital signs in last 24 hours: Temp:  [97.5 F (36.4 C)-99.7 F (37.6 C)] 99.7 F (37.6 C) (07/26 0843) Pulse Rate:  [58-99] 99 (07/26 0843) Resp:  [16-22] 19 (07/26 0843) BP: (115-148)/(50-81) 134/74 (07/26 0843) SpO2:  [99 %-100 %] 100 % (07/26 0843) Weight change:    PE: Abdomen soft, appears alert and oriented  Lab Results: Results for orders placed or performed during the hospital encounter of 02/18/16 (from the past 24 hour(s))  Lipase, blood     Status: None   Collection Time: 02/20/16  2:33 PM  Result Value Ref Range   Lipase 50 11 - 51 U/L  Comprehensive metabolic panel     Status: Abnormal   Collection Time: 02/21/16  4:03 AM  Result Value Ref Range   Sodium 138 135 - 145 mmol/L   Potassium 4.0 3.5 - 5.1 mmol/L   Chloride 106 101 - 111 mmol/L   CO2 28 22 - 32 mmol/L   Glucose, Bld 89 65 - 99 mg/dL   BUN 11 6 - 20 mg/dL   Creatinine, Ser 0.21 0.44 - 1.00 mg/dL   Calcium 8.6 (L) 8.9 - 10.3 mg/dL   Total Protein 6.5 6.5 - 8.1 g/dL   Albumin 2.4 (L) 3.5 - 5.0 g/dL   AST 60 (H) 15 - 41 U/L   ALT 90 (H) 14 - 54 U/L   Alkaline Phosphatase 97 38 - 126 U/L   Total Bilirubin 0.4 0.3 - 1.2 mg/dL   GFR calc non Af Amer >60 >60 mL/min   GFR calc Af Amer >60 >60 mL/min   Anion gap 4 (L) 5 - 15  CBC     Status: Abnormal   Collection Time: 02/21/16  4:03 AM  Result Value Ref Range   WBC 6.8 4.0 - 10.5 K/uL   RBC 3.68 (L) 3.87 - 5.11 MIL/uL   Hemoglobin 11.0 (L) 12.0 - 15.0 g/dL   HCT 11.5 (L) 52.0 - 80.2 %   MCV 95.1 78.0 - 100.0 fL   MCH 29.9 26.0 - 34.0 pg   MCHC 31.4 30.0 - 36.0 g/dL   RDW 23.3 61.2 - 24.4 %   Platelets 96 (L) 150 - 400 K/uL    Studies/Results: Mr Abdomen Mrcp Wo Cm  Result Date: 02/20/2016 CLINICAL DATA:  Cholecystitis, preop laparoscopic cholecystectomy, evaluate for CBD stone EXAM: MRI ABDOMEN WITHOUT CONTRAST  (INCLUDING MRCP)  TECHNIQUE: Multiplanar multisequence MR imaging of the abdomen was performed. Heavily T2-weighted images of the biliary and pancreatic ducts were obtained, and three-dimensional MRCP images were rendered by post processing. COMPARISON:  Right upper quadrant ultrasound dated 02/18/2016 FINDINGS: Severely motion degraded images. Lower chest:  Lung bases are clear. Hepatobiliary: 10 mm cyst inferiorly in segment 6 (series 3/ image 22). No suspicious hepatic lesions. Layering small gallstones (series 3/ image 26), without associated gallbladder wall thickening or inflammatory changes. Mild central intrahepatic ductal dilatation. Dilated common duct, measuring 10 mm (series 11/image 28). Stacked small distal CBD stones (series 5/ image 16), poorly visualized due motion degradation. Pancreas: Mild peripancreatic fluid along the pancreatic tail (series 3/image 18), nonspecific. Correlate for acute pancreatitis. No associated pancreatic ductal dilatation. Spleen: Within normal limits. Adrenals/Urinary Tract: Adrenal glands are within normal limits. Kidneys are within normal limits, noting nonspecific perinephric fluid bilaterally. No hydronephrosis. Stomach/Bowel: Stomach is within normal limits. Visualized bowel is unremarkable. Vascular/Lymphatic: No evidence of abdominal aortic aneurysm. No  suspicious abdominal lymphadenopathy. Other: No abdominal ascites. Musculoskeletal: No focal osseous lesions. IMPRESSION: Severely motion degraded images. Cholelithiasis, without associated inflammatory changes to suggest acute cholecystitis. Mild intrahepatic and extrahepatic ductal dilatation. Common duct measures 10 mm. Choledocholithiasis, with small distal CBD stones. ERCP is suggested. Mild peripancreatic fluid along the pancreatic tail, nonspecific. Correlate for acute pancreatitis. Electronically Signed   By: Charline Bills M.D.   On: 02/20/2016 07:43     Assessment: Common bile duct stones presumably causing SIRS  with no fever or leukocytosis or pain and minimally elevated LFTs  Plan: Continue Rocephin ERCP tomorrow.    Faith Branan C 02/21/2016, 9:59 AM  Pager [phone] If no answer or after 5 PM call [phone]

## 2016-02-21 NOTE — Progress Notes (Addendum)
PROGRESS NOTE    Taylor Bass  HER:[id number] DOB: [birth year] DOA: 02/18/2016 PCP: Jackie Plum, MD   Brief Narrative:  Taylor Bass is a pleasant 82 year old female with a past medical history of hypertension, thrombus cytopenia, admitted to the medicine service on 02/18/2016 with complaints of fevers, chills, found to be febrile with temperature 102, hypotensive with systolic blood pressures in the 70s and 80s and a lactic acid of 4.5. Initial workup did not show obvious source of infection as chest x-ray and urinalysis were unremarkable. She was started on broad-spectrum IV antibiotic therapy with vancomycin and Zosyn. On the following day microbiology reported one out of 2 blood cultures positive for gram-negative rods. Lab work revealed elevated transaminases that was further worked up with right upper quadrant ultrasound. Did not show evidence of acute cholecystitis however there was, bile duct dilatation to 8 mm. This was further worked up with MRCP given no other clear source of infection.    Assessment & Plan:   Principal Problem:   Sepsis (HCC) Active Problems:   Elevated AST (SGOT)   Elevated ALT measurement   Thrombocytopenia (HCC)   Fever   Lactic acidosis   Tobacco abuse   Hypotension   Hypothyroidism   1.  Biliary  Sepsis , blood culture shows Klebsiella oxytoca Presented with systolic blood pressures in the 80s, elevated lactic acid level of 4.5, temperature 102.4, heart rate of 101 with blood cultures growing gram-negative rods -Having elevated transaminases she had a right upper quadrant ultrasound that did not show evidence for acute cholecystitis. Radiology did reports dilated common bile duct measuring up to 8 mm.  MRCP showed CBD 1 cm with small distal CBD stones and mild peripancreatic fluid at the tail. Lipase not checked on admit but 50 today-  continue IV fluid resuscitation, narrow antibiotic therapy to Zosyn with discontinuation of vancomycin -Lactic acid  levels trended down to 1.4   antibiotic regimen narrowed to ceftriaxone   ERCP tentatively planned for Thursday 02/22/16.   2.  Elevated transaminases -Lab work showing elevated AST of 213 with ALT of 172. Now 60/90. Her total bilirubin was normal at 0.6 and Alkaline Phosphatase was 96. This data does not quite fit the cholestatic liver disease pattern. Elevated transaminases could be related to shock liver from sepsis. -On 02/20/2016 lab work showing downward trend in transaminases -Continue IV fluids, supportive care, repeat CMP in a.m.   3.  History of thrombocytopenia -Since 2013 platelet count has fluctuated between 90 and 151  4.  History of hypertension -She had been on Hyzaar in the outpatient setting which was discontinued due to sepsis and hypotension  5.  Hypothyroidism -Continue Synthroid 100 g by mouth daily  -Labs showed TSH of 0.676   DVT prophylaxis: SCD's  Code Status: Full Code Family Communication: Jeralyn Bennett, MD spoke with her son Harvie Heck [phone] on 02/20/2016, updated him on patient's condition Disposition Plan:  Continue telemetry, ERCP tomorrow  Consultants:   Gastroenterology  Procedures:     Antimicrobials:   Zosyn started on 02/18/2016 discontinued on 02/20/2016  Vancomycin started on 02/18/2016 and discontinued on 02/19/2016  Ceftriaxone started on 02/20/2016   Subjective: She states doing well, tolerating by mouth, denies fevers, chills, has no complaints  Objective: Vitals:   02/20/16 1534 02/20/16 2119 02/21/16 0408 02/21/16 0843  BP: (!) 115/50 (!) 123/59 (!) 148/81 134/74  Pulse: 67 75 66 99  Resp: 16  (!) 22 19  Temp: 97.5 F (36.4 C) 99.2 F (37.3 C) 97.7  F (36.5 C) 99.7 F (37.6 C)  TempSrc: Oral Oral  Oral  SpO2: 99% 100% 100% 100%  Weight:      Height:        Intake/Output Summary (Last 24 hours) at 02/21/16 1028 Last data filed at 02/21/16 0844  Gross per 24 hour  Intake              370 ml  Output               525 ml  Net             -155 ml   Filed Weights   02/18/16 1354 02/20/16 0424  Weight: 51 kg (112 lb 7 oz) 57.7 kg (127 lb 3.2 oz)    Examination:  General exam: Awake and alert, answering questions appropriately, she is nontoxic appearing Respiratory system: Clear to auscultation. Respiratory effort normal. Cardiovascular system:S1 & S2 heard, RRR. No JVD, murmurs, rubs, gallops or clicks. No pedal edema. Gastrointestinal system: For abdominal exam was benign, noting she did not have pain to palpation involving right upper quadrant. Central nervous system: Alert and oriented. No focal neurological deficits. Extremities: Symmetric 5 x 5 power. Skin: No rashes, lesions or ulcers Psychiatry: Judgement and insight appear normal. Mood & affect appropriate.     Data Reviewed: I have personally reviewed following labs and imaging studies  CBC:  Recent Labs Lab 02/18/16 1319 02/19/16 0247 02/20/16 0224 02/21/16 0403  WBC 9.1 7.9 5.9 6.8  NEUTROABS 7.5  --   --   --   HGB 12.6 10.1* 10.4* 11.0*  HCT 39.0 32.5* 33.4* 35.0*  MCV 95.6 95.6 95.4 95.1  PLT 108* 90* 82* 96*   Basic Metabolic Panel:  Recent Labs Lab 02/18/16 1319 02/19/16 0247 02/20/16 0224 02/21/16 0403  NA 137 138 140 138  K 3.9 3.6 3.8 4.0  CL 104 108 111 106  CO2 25 27 24 28   GLUCOSE 108* 83 82 89  BUN 21* 15 10 11   CREATININE 0.96 0.91 0.77 0.86  CALCIUM 9.2 8.1* 8.1* 8.6*   GFR: Estimated Creatinine Clearance: 47.7 mL/min (by C-G formula based on SCr of 0.86 mg/dL). Liver Function Tests:  Recent Labs Lab 02/18/16 1319 02/19/16 0247 02/20/16 0224 02/21/16 0403  AST 232* 213* 97* 60*  ALT 128* 172* 116* 90*  ALKPHOS 126 96 86 97  BILITOT 0.6 0.6 0.4 0.4  PROT 8.3* 6.1* 6.0* 6.5  ALBUMIN 3.2* 2.3* 2.2* 2.4*    Recent Labs Lab 02/20/16 1433  LIPASE 50   No results for input(s): AMMONIA in the last 168 hours. Coagulation Profile:  Recent Labs Lab 02/18/16 1733  INR 1.22    Cardiac Enzymes: No results for input(s): CKTOTAL, CKMB, CKMBINDEX, TROPONINI in the last 168 hours. BNP (last 3 results) No results for input(s): PROBNP in the last 8760 hours. HbA1C: No results for input(s): HGBA1C in the last 72 hours. CBG:  Recent Labs Lab 02/19/16 1512  GLUCAP 83   Lipid Profile: No results for input(s): CHOL, HDL, LDLCALC, TRIG, CHOLHDL, LDLDIRECT in the last 72 hours. Thyroid Function Tests:  Recent Labs  02/18/16 1733  TSH 0.676   Anemia Panel: No results for input(s): VITAMINB12, FOLATE, FERRITIN, TIBC, IRON, RETICCTPCT in the last 72 hours. Sepsis Labs:  Recent Labs Lab 02/18/16 1332 02/18/16 1733 02/18/16 1735 02/18/16 2035  PROCALCITON  --  2.42  --   --   LATICACIDVEN 4.53*  --  2.3* 1.4    Recent Results (  from the past 240 hour(s))  Urine culture     Status: Abnormal   Collection Time: 02/18/16  1:05 PM  Result Value Ref Range Status   Specimen Description URINE, RANDOM  Final   Special Requests NONE  Final   Culture MULTIPLE SPECIES PRESENT, SUGGEST RECOLLECTION (A)  Final   Report Status 02/19/2016 FINAL  Final  Culture, blood (Routine x 2)     Status: Abnormal   Collection Time: 02/18/16  1:16 PM  Result Value Ref Range Status   Specimen Description BLOOD RIGHT ANTECUBITAL  Final   Special Requests BOTTLES DRAWN AEROBIC AND ANAEROBIC  10CC  Final   Culture  Setup Time   Final    GRAM NEGATIVE RODS AEROBIC BOTTLE ONLY CRITICAL RESULT CALLED TO, READ BACK BY AND VERIFIED WITH: Artelia Laroche PHARMD AT 1610 02/19/16 BY D. VANHOOK    Culture KLEBSIELLA OXYTOCA (A)  Final   Report Status 02/21/2016 FINAL  Final   Organism ID, Bacteria KLEBSIELLA OXYTOCA  Final      Susceptibility   Klebsiella oxytoca - MIC*    AMPICILLIN 16 RESISTANT Resistant     CEFAZOLIN 16 SENSITIVE Sensitive     CEFEPIME <=1 SENSITIVE Sensitive     CEFTAZIDIME <=1 SENSITIVE Sensitive     CEFTRIAXONE <=1 SENSITIVE Sensitive     CIPROFLOXACIN <=0.25  SENSITIVE Sensitive     GENTAMICIN <=1 SENSITIVE Sensitive     IMIPENEM 0.5 SENSITIVE Sensitive     TRIMETH/SULFA <=20 SENSITIVE Sensitive     AMPICILLIN/SULBACTAM <=2 SENSITIVE Sensitive     PIP/TAZO <=4 SENSITIVE Sensitive     * KLEBSIELLA OXYTOCA  Blood Culture ID Panel (Reflexed)     Status: Abnormal   Collection Time: 02/18/16  1:16 PM  Result Value Ref Range Status   Enterococcus species NOT DETECTED NOT DETECTED Final   Vancomycin resistance NOT DETECTED NOT DETECTED Final   Listeria monocytogenes NOT DETECTED NOT DETECTED Final   Staphylococcus species NOT DETECTED NOT DETECTED Final   Staphylococcus aureus NOT DETECTED NOT DETECTED Final   Methicillin resistance NOT DETECTED NOT DETECTED Final   Streptococcus species NOT DETECTED NOT DETECTED Final   Streptococcus agalactiae NOT DETECTED NOT DETECTED Final   Streptococcus pneumoniae NOT DETECTED NOT DETECTED Final   Streptococcus pyogenes NOT DETECTED NOT DETECTED Final   Acinetobacter baumannii NOT DETECTED NOT DETECTED Final   Enterobacteriaceae species DETECTED (A) NOT DETECTED Final    Comment: CRITICAL RESULT CALLED TO, READ BACK BY AND VERIFIED WITH: Artelia Laroche PHARMD AT 9604 02/19/16 BY D. VANHOOK    Enterobacter cloacae complex NOT DETECTED NOT DETECTED Final   Escherichia coli NOT DETECTED NOT DETECTED Final   Klebsiella oxytoca DETECTED (A) NOT DETECTED Final    Comment: CRITICAL RESULT CALLED TO, READ BACK BY AND VERIFIED WITH: Artelia Laroche PHARMD AT 0925 02/19/16 BY D. VANHOOK    Klebsiella pneumoniae NOT DETECTED NOT DETECTED Final   Proteus species NOT DETECTED NOT DETECTED Final   Serratia marcescens NOT DETECTED NOT DETECTED Final   Carbapenem resistance NOT DETECTED NOT DETECTED Final   Haemophilus influenzae NOT DETECTED NOT DETECTED Final   Neisseria meningitidis NOT DETECTED NOT DETECTED Final   Pseudomonas aeruginosa NOT DETECTED NOT DETECTED Final   Candida albicans NOT DETECTED NOT DETECTED Final    Candida glabrata NOT DETECTED NOT DETECTED Final   Candida krusei NOT DETECTED NOT DETECTED Final   Candida parapsilosis NOT DETECTED NOT DETECTED Final   Candida tropicalis NOT DETECTED NOT DETECTED Final  Culture, blood (Routine x 2)     Status: None (Preliminary result)   Collection Time: 02/18/16  1:45 PM  Result Value Ref Range Status   Specimen Description BLOOD LEFT ANTECUBITAL  Final   Special Requests BOTTLES DRAWN AEROBIC AND ANAEROBIC  5CC  Final   Culture NO GROWTH 2 DAYS  Final   Report Status PENDING  Incomplete  MRSA PCR Screening     Status: None   Collection Time: 02/18/16  6:50 PM  Result Value Ref Range Status   MRSA by PCR NEGATIVE NEGATIVE Final    Comment:        The GeneXpert MRSA Assay (FDA approved for NASAL specimens only), is one component of a comprehensive MRSA colonization surveillance program. It is not intended to diagnose MRSA infection nor to guide or monitor treatment for MRSA infections.          Radiology Studies: Mr Abdomen Mrcp Wo Cm  Result Date: 02/20/2016 CLINICAL DATA:  Cholecystitis, preop laparoscopic cholecystectomy, evaluate for CBD stone EXAM: MRI ABDOMEN WITHOUT CONTRAST  (INCLUDING MRCP) TECHNIQUE: Multiplanar multisequence MR imaging of the abdomen was performed. Heavily T2-weighted images of the biliary and pancreatic ducts were obtained, and three-dimensional MRCP images were rendered by post processing. COMPARISON:  Right upper quadrant ultrasound dated 02/18/2016 FINDINGS: Severely motion degraded images. Lower chest:  Lung bases are clear. Hepatobiliary: 10 mm cyst inferiorly in segment 6 (series 3/ image 22). No suspicious hepatic lesions. Layering small gallstones (series 3/ image 26), without associated gallbladder wall thickening or inflammatory changes. Mild central intrahepatic ductal dilatation. Dilated common duct, measuring 10 mm (series 11/image 28). Stacked small distal CBD stones (series 5/ image 16), poorly  visualized due motion degradation. Pancreas: Mild peripancreatic fluid along the pancreatic tail (series 3/image 18), nonspecific. Correlate for acute pancreatitis. No associated pancreatic ductal dilatation. Spleen: Within normal limits. Adrenals/Urinary Tract: Adrenal glands are within normal limits. Kidneys are within normal limits, noting nonspecific perinephric fluid bilaterally. No hydronephrosis. Stomach/Bowel: Stomach is within normal limits. Visualized bowel is unremarkable. Vascular/Lymphatic: No evidence of abdominal aortic aneurysm. No suspicious abdominal lymphadenopathy. Other: No abdominal ascites. Musculoskeletal: No focal osseous lesions. IMPRESSION: Severely motion degraded images. Cholelithiasis, without associated inflammatory changes to suggest acute cholecystitis. Mild intrahepatic and extrahepatic ductal dilatation. Common duct measures 10 mm. Choledocholithiasis, with small distal CBD stones. ERCP is suggested. Mild peripancreatic fluid along the pancreatic tail, nonspecific. Correlate for acute pancreatitis. Electronically Signed   By: Charline Bills M.D.   On: 02/20/2016 07:43       Scheduled Meds: . cefTRIAXone (ROCEPHIN)  IV  2 g Intravenous Q24H  . feeding supplement (ENSURE ENLIVE)  237 mL Oral BID BM  . levothyroxine  100 mcg Oral QAC breakfast  . mirtazapine  15 mg Oral QHS  . sodium chloride flush  3 mL Intravenous Q12H   Continuous Infusions:     LOS: 3 days    Time spent: 35 min    Kortland Nichols, MD [facility] Pager [phone]  If 7PM-7AM, please contact night-coverage [email or web address] Password [facility] 02/21/2016, 10:28 AM

## 2016-02-22 ENCOUNTER — Encounter (HOSPITAL_COMMUNITY)
Admission: EM | Disposition: A | Discharge status: Home Health | Payer: Self-pay | Source: Home / Self Care | Attending: Internal Medicine

## 2016-02-22 ENCOUNTER — Inpatient Hospital Stay (HOSPITAL_COMMUNITY): Payer: Medicare Other | Admitting: Anesthesiology

## 2016-02-22 ENCOUNTER — Inpatient Hospital Stay (HOSPITAL_COMMUNITY): Payer: Medicare Other

## 2016-02-22 ENCOUNTER — Encounter (HOSPITAL_COMMUNITY): Payer: Self-pay | Admitting: *Deleted

## 2016-02-22 HISTORY — PX: ERCP: SHX5425

## 2016-02-22 LAB — CBC
HCT: 35.8 % — ABNORMAL LOW (ref 36.0–46.0)
Hemoglobin: 11.9 g/dL — ABNORMAL LOW (ref 12.0–15.0)
MCH: 30.8 pg (ref 26.0–34.0)
MCHC: 33.2 g/dL (ref 30.0–36.0)
MCV: 92.7 fL (ref 78.0–100.0)
PLATELETS: 105 10*3/uL — AB (ref 150–400)
RBC: 3.86 MIL/uL — ABNORMAL LOW (ref 3.87–5.11)
RDW: 15.2 % (ref 11.5–15.5)
WBC: 7.2 10*3/uL (ref 4.0–10.5)

## 2016-02-22 LAB — COMPREHENSIVE METABOLIC PANEL
ALT: 72 U/L — ABNORMAL HIGH (ref 14–54)
ANION GAP: 8 (ref 5–15)
AST: 39 U/L (ref 15–41)
Albumin: 2.5 g/dL — ABNORMAL LOW (ref 3.5–5.0)
Alkaline Phosphatase: 89 U/L (ref 38–126)
BUN: 6 mg/dL (ref 6–20)
CHLORIDE: 107 mmol/L (ref 101–111)
CO2: 25 mmol/L (ref 22–32)
Calcium: 8.8 mg/dL — ABNORMAL LOW (ref 8.9–10.3)
Creatinine, Ser: 0.66 mg/dL (ref 0.44–1.00)
Glucose, Bld: 77 mg/dL (ref 65–99)
POTASSIUM: 4 mmol/L (ref 3.5–5.1)
Sodium: 140 mmol/L (ref 135–145)
Total Bilirubin: 0.5 mg/dL (ref 0.3–1.2)
Total Protein: 6.7 g/dL (ref 6.5–8.1)

## 2016-02-22 SURGERY — ERCP, WITH INTERVENTION IF INDICATED
Anesthesia: General

## 2016-02-22 MED ORDER — LACTATED RINGERS IV SOLN
INTRAVENOUS | Status: DC | PRN
  Administered 2016-02-22: 12:00:00 via INTRAVENOUS

## 2016-02-22 MED ORDER — ALBUTEROL SULFATE HFA 108 (90 BASE) MCG/ACT IN AERS
INHALATION_SPRAY | RESPIRATORY_TRACT | Status: DC | PRN
  Administered 2016-02-22: 2 via RESPIRATORY_TRACT

## 2016-02-22 MED ORDER — LIDOCAINE HCL (CARDIAC) 20 MG/ML IV SOLN
INTRAVENOUS | Status: DC | PRN
  Administered 2016-02-22: 50 mg via INTRAVENOUS

## 2016-02-22 MED ORDER — PHENYLEPHRINE HCL 10 MG/ML IJ SOLN
INTRAMUSCULAR | Status: DC | PRN
  Administered 2016-02-22 (×5): 80 ug via INTRAVENOUS

## 2016-02-22 MED ORDER — IOPAMIDOL (ISOVUE-300) INJECTION 61%
INTRAVENOUS | Status: AC
  Filled 2016-02-22: qty 50

## 2016-02-22 MED ORDER — SODIUM CHLORIDE 0.9 % IV SOLN
INTRAVENOUS | Status: DC | PRN
  Administered 2016-02-22: 28 mL

## 2016-02-22 MED ORDER — ONDANSETRON HCL 4 MG/2ML IJ SOLN
INTRAMUSCULAR | Status: DC | PRN
Start: 2016-02-22 — End: 2016-02-22
  Administered 2016-02-22: 4 mg via INTRAVENOUS

## 2016-02-22 MED ORDER — ONDANSETRON HCL 4 MG/2ML IJ SOLN: 4.0000 mg | Freq: Once | INTRAMUSCULAR | Status: DC | PRN

## 2016-02-22 MED ORDER — MEPERIDINE HCL 100 MG/ML IJ SOLN: 6.2500 mg | INTRAMUSCULAR | Status: DC | PRN

## 2016-02-22 MED ORDER — FENTANYL CITRATE (PF) 100 MCG/2ML IJ SOLN
INTRAMUSCULAR | Status: DC | PRN
  Administered 2016-02-22: 100 ug via INTRAVENOUS

## 2016-02-22 MED ORDER — ROCURONIUM BROMIDE 100 MG/10ML IV SOLN
INTRAVENOUS | Status: DC | PRN
  Administered 2016-02-22: 30 mg via INTRAVENOUS

## 2016-02-22 MED ORDER — ALBUMIN HUMAN 5 % IV SOLN
INTRAVENOUS | Status: DC | PRN
  Administered 2016-02-22: 12:00:00 via INTRAVENOUS

## 2016-02-22 MED ORDER — SODIUM CHLORIDE 0.9 % IV SOLN: INTRAVENOUS | Status: DC

## 2016-02-22 MED ORDER — PROPOFOL 10 MG/ML IV BOLUS
INTRAVENOUS | Status: DC | PRN
  Administered 2016-02-22: 100 mg via INTRAVENOUS

## 2016-02-22 MED ORDER — SUGAMMADEX SODIUM 200 MG/2ML IV SOLN
INTRAVENOUS | Status: DC | PRN
  Administered 2016-02-22: 200 mg via INTRAVENOUS

## 2016-02-22 MED ORDER — FENTANYL CITRATE (PF) 100 MCG/2ML IJ SOLN: 25.0000 ug | INTRAMUSCULAR | Status: DC | PRN

## 2016-02-22 MED ORDER — GLUCAGON HCL RDNA (DIAGNOSTIC) 1 MG IJ SOLR
INTRAMUSCULAR | Status: AC
  Filled 2016-02-22: qty 1

## 2016-02-22 MED ORDER — PHENYLEPHRINE HCL 10 MG/ML IJ SOLN
INTRAVENOUS | Status: DC | PRN
  Administered 2016-02-22: 40 ug/min via INTRAVENOUS

## 2016-02-22 NOTE — Care Management Important Message (Signed)
Important Message  Patient Details  Name: Taylor Bass MRN: [id number] Date of Birth: 03/24/1934   Medicare Important Message Given:  Yes    Bernadette Hoit 02/22/2016, 11:48 AM

## 2016-02-22 NOTE — Anesthesia Preprocedure Evaluation (Signed)
Anesthesia Evaluation  Patient identified by MRN, date of birth, ID band Patient awake    Reviewed: Allergy & Precautions, H&P , Patient's Chart, lab work & pertinent test results, reviewed documented beta blocker date and time   Airway Mallampati: II  TM Distance: >3 FB Neck ROM: full    Dental no notable dental hx.    Pulmonary Current Smoker,    Pulmonary exam normal breath sounds clear to auscultation       Cardiovascular hypertension, On Medications  Rhythm:regular Rate:Normal     Neuro/Psych    GI/Hepatic   Endo/Other    Renal/GU      Musculoskeletal   Abdominal   Peds  Hematology   Anesthesia Other Findings HTN Low platelets  Reproductive/Obstetrics                             Anesthesia Physical Anesthesia Plan  ASA: II  Anesthesia Plan: General   Post-op Pain Management:    Induction: Intravenous  Airway Management Planned: Oral ETT  Additional Equipment:   Intra-op Plan:   Post-operative Plan: Extubation in OR  Informed Consent: I have reviewed the patients History and Physical, chart, labs and discussed the procedure including the risks, benefits and alternatives for the proposed anesthesia with the patient or authorized representative who has indicated his/her understanding and acceptance.   Dental Advisory Given and Dental advisory given  Plan Discussed with: CRNA and Surgeon  Anesthesia Plan Comments: (  Discussed general anesthesia, including possible nausea, instrumentation of airway, sore throat,pulmonary aspiration, etc. I asked if the were any outstanding questions, or  concerns before we proceeded. )        Anesthesia Quick Evaluation

## 2016-02-22 NOTE — Progress Notes (Signed)
Taylor Bass 12:04 PM  Subjective: Patient without any complaints and no swallowing problems and we rediscussed her procedure and her case was discussed with my partner Dr. Madilyn Fireman and Dr. Bosie Clos and her hospital computer chart was reviewed  Objective: Vital signs stable afebrile no acute distress exam please see preassessment evaluation labs and MR CP reviewed  Assessment: CBD stones  Plan: We rediscussed ERCP with the patient and will proceed this morning with anesthesia assistance  [facility] E  Pager [phone] After 5PM or if no answer call [phone]

## 2016-02-22 NOTE — Care Management Note (Addendum)
Case Management Note  Patient Details  Name: Taylor Bass MRN: [id number] Date of Birth: 12-06-1933  Subjective/Objective:      CM following for progression and d/c planning.              Action/Plan: 02/22/2016 Met with pt and family, [facility] selected for [facility] services. [facility] notified. Pt will need 3:1 commode. Lives at home with family. No further needs identifed.   Expected Discharge Date:   02/23/2016               Expected Discharge Plan:  [place]  In-House Referral:  NA  Discharge planning Services  CM Consult  Post Acute Care Choice:  Durable Medical Equipment, Home Health Choice offered to:  Patient  DME Arranged:  3-N-1 DME Agency:  [place] Arranged:  PT, OT, Nurse's Aide [place] Agency:  [place]  Status of Service:  Completed, signed off  If discussed at [place] of Stay Meetings, dates discussed:    Additional Comments:  Adron Bene, RN 02/22/2016, 3:00 PM

## 2016-02-22 NOTE — Plan of Care (Signed)
Problem: Safety: Goal: Ability to remain free from injury will improve Outcome: Progressing Pt is in a camera room & has a bed alarm in place. The proper equipment is being utilized such as a BSC. Will continue to monitor the pt.

## 2016-02-22 NOTE — Anesthesia Postprocedure Evaluation (Signed)
Anesthesia Post Note  Patient: Taylor Bass  Procedure(s) Performed: Procedure(s) (LRB): ENDOSCOPIC RETROGRADE CHOLANGIOPANCREATOGRAPHY (ERCP) (N/A)  Patient location during evaluation: PACU Anesthesia Type: General Level of consciousness: sedated Pain management: satisfactory to patient Vital Signs Assessment: post-procedure vital signs reviewed and stable Respiratory status: spontaneous breathing Cardiovascular status: stable Anesthetic complications: no    Last Vitals:  Vitals:   02/22/16 1405 02/22/16 1415  BP: (!) 122/58 117/60  Pulse: 65 64  Resp: 16 16  Temp:      Last Pain:  Vitals:   02/22/16 0921  TempSrc: Oral  PainSc:                  Jiles Garter

## 2016-02-22 NOTE — Anesthesia Procedure Notes (Signed)
Procedure Name: Intubation Date/Time: 02/22/2016 12:32 PM Performed by: Ferol Luz L Pre-anesthesia Checklist: Patient identified, Emergency Drugs available, Suction available and Patient being monitored Patient Re-evaluated:Patient Re-evaluated prior to inductionOxygen Delivery Method: Circle System Utilized Preoxygenation: Pre-oxygenation with 100% oxygen Intubation Type: IV induction Ventilation: Mask ventilation without difficulty Laryngoscope Size: McGraph and 3 Grade View: Grade I Tube type: Oral Tube size: 7.5 mm Number of attempts: 1 Airway Equipment and Method: Stylet Placement Confirmation: ETT inserted through vocal cords under direct vision,  positive ETCO2 and breath sounds checked- equal and bilateral Secured at: 20 cm Tube secured with: Tape Dental Injury: Teeth and Oropharynx as per pre-operative assessment

## 2016-02-22 NOTE — Progress Notes (Signed)
PROGRESS NOTE    JURY CASERTA  XLK:[id number] DOB: [birth year] DOA: 02/18/2016 PCP: Jackie Plum, MD   Brief Narrative:  Mrs Eble is a pleasant 82 year old female with a past medical history of hypertension, thrombus cytopenia, admitted to the medicine service on 02/18/2016 with complaints of fevers, chills, found to be febrile with temperature 102, hypotensive with systolic blood pressures in the 70s and 80s and a lactic acid of 4.5. Initial workup did not show obvious source of infection as chest x-ray and urinalysis were unremarkable. She was started on broad-spectrum IV antibiotic therapy with vancomycin and Zosyn. On the following day microbiology reported one out of 2 blood cultures positive for gram-negative rods. Lab work revealed elevated transaminases that was further worked up with right upper quadrant ultrasound. Did not show evidence of acute cholecystitis however there was, bile duct dilatation to 8 mm. Common bile duct stones presumably causing SIRS with no fever or leukocytosis or pain and minimally elevated LFTs, ERCP 7/27  Assessment & Plan:   Principal Problem:   Sepsis (HCC) Active Problems:   Elevated AST (SGOT)   Elevated ALT measurement   Thrombocytopenia (HCC)   Fever   Lactic acidosis   Tobacco abuse   Hypotension   Hypothyroidism   1.  Biliary  Sepsis , blood culture shows Klebsiella oxytoca On admission, systolic blood pressures was in the 80s, elevated lactic acid level of 4.5, temperature 102.4, heart rate of 101 with blood cultures growing gram-negative rods   elevated transaminases , right upper quadrant ultrasound that did not show evidence for acute cholecystitis. Radiology did reports dilated common bile duct measuring up to 8 mm.  MRCP showed CBD 1 cm with small distal CBD stones and mild peripancreatic fluid at the tail. Lipase not significantly elevated  continue IV fluid resuscitation, narrowed antibiotic therapy to Rocephin, discontinued Zosyn  and vancomycin -Lactic acid levels trended down to 1.4   antibiotic regimen narrowed to ceftriaxone   ERCP tentatively planned for Thursday 02/22/16.   2.  Elevated transaminases -Lab work showing elevated AST of 213 with ALT of 172. Now 39/72. Her total bilirubin was normal at 0.6 and Alkaline Phosphatase was normal since admission.  . -Continue IV fluids, supportive care, repeat CMP in a.m. Gastroenterology feels that the patient has choledocholithiasis with small nonobstructing stones ERCP today  3.  History of thrombocytopenia -Since 2013 platelet count has fluctuated between 90 and 151  4.  History of hypertension -She had been on Hyzaar in the outpatient setting which was discontinued due to sepsis and hypotension  5.  Hypothyroidism -Continue Synthroid 100 g by mouth daily  -Labs showed TSH of 0.676   DVT prophylaxis: SCD's  Code Status: Full Code Family Communication: Jeralyn Bennett, MD spoke with her son Harvie Heck [phone] on 02/20/2016, updated him on patient's condition Disposition Plan:  Continue telemetry, ERCP today  Consultants:   Gastroenterology  Procedures:     Antimicrobials:   Zosyn started on 02/18/2016 discontinued on 02/20/2016  Vancomycin started on 02/18/2016 and discontinued on 02/19/2016  Ceftriaxone started on 02/20/2016   Subjective: She states doing well, tolerating by mouth, denies fevers, chills, has no complaints  Objective: Vitals:   02/21/16 0843 02/21/16 1749 02/21/16 2331 02/22/16 0500  BP: 134/74 136/75 (!) 149/69 140/73  Pulse: 99 67 (!) 59 62  Resp: 19 17 16 17   Temp: 99.7 F (37.6 C) 98.5 F (36.9 C) 99 F (37.2 C) 98 F (36.7 C)  TempSrc: Oral Oral Oral Oral  SpO2: 100%  98% 100% 100%  Weight:      Height:        Intake/Output Summary (Last 24 hours) at 02/22/16 0828 Last data filed at 02/22/16 0700  Gross per 24 hour  Intake          3053.75 ml  Output              702 ml  Net          2351.75 ml   Filed  Weights   02/18/16 1354 02/20/16 0424  Weight: 51 kg (112 lb 7 oz) 57.7 kg (127 lb 3.2 oz)    Examination:  General exam: Awake and alert, answering questions appropriately, she is nontoxic appearing Respiratory system: Clear to auscultation. Respiratory effort normal. Cardiovascular system:S1 & S2 heard, RRR. No JVD, murmurs, rubs, gallops or clicks. No pedal edema. Gastrointestinal system: For abdominal exam was benign, noting she did not have pain to palpation involving right upper quadrant. Central nervous system: Alert and oriented. No focal neurological deficits. Extremities: Symmetric 5 x 5 power. Skin: No rashes, lesions or ulcers Psychiatry: Judgement and insight appear normal. Mood & affect appropriate.     Data Reviewed: I have personally reviewed following labs and imaging studies  CBC:  Recent Labs Lab 02/18/16 1319 02/19/16 0247 02/20/16 0224 02/21/16 0403 02/22/16 0521  WBC 9.1 7.9 5.9 6.8 7.2  NEUTROABS 7.5  --   --   --   --   HGB 12.6 10.1* 10.4* 11.0* 11.9*  HCT 39.0 32.5* 33.4* 35.0* 35.8*  MCV 95.6 95.6 95.4 95.1 92.7  PLT 108* 90* 82* 96* 105*   Basic Metabolic Panel:  Recent Labs Lab 02/18/16 1319 02/19/16 0247 02/20/16 0224 02/21/16 0403 02/22/16 0521  NA 137 138 140 138 140  K 3.9 3.6 3.8 4.0 4.0  CL 104 108 111 106 107  CO2 GLUCOSE 108* 83 82 89 77  BUN 21* CREATININE 0.96 0.91 0.77 0.86 0.66  CALCIUM 9.2 8.1* 8.1* 8.6* 8.8*   GFR: Estimated Creatinine Clearance: 51.3 mL/min (by C-G formula based on SCr of 0.8 mg/dL). Liver Function Tests:  Recent Labs Lab 02/18/16 1319 02/19/16 0247 02/20/16 0224 02/21/16 0403 02/22/16 0521  AST 232* 213* 97* 60* 39  ALT 128* 172* 116* 90* 72*  ALKPHOS 126 96 86 97 89  BILITOT 0.6 0.6 0.4 0.4 0.5  PROT 8.3* 6.1* 6.0* 6.5 6.7  ALBUMIN 3.2* 2.3* 2.2* 2.4* 2.5*    Recent Labs Lab 02/20/16 1433  LIPASE 50   No results for input(s): AMMONIA in the last 168  hours. Coagulation Profile:  Recent Labs Lab 02/18/16 1733  INR 1.22   Cardiac Enzymes: No results for input(s): CKTOTAL, CKMB, CKMBINDEX, TROPONINI in the last 168 hours. BNP (last 3 results) No results for input(s): PROBNP in the last 8760 hours. HbA1C: No results for input(s): HGBA1C in the last 72 hours. CBG:  Recent Labs Lab 02/19/16 1512  GLUCAP 83   Lipid Profile: No results for input(s): CHOL, HDL, LDLCALC, TRIG, CHOLHDL, LDLDIRECT in the last 72 hours. Thyroid Function Tests: No results for input(s): TSH, T4TOTAL, FREET4, T3FREE, THYROIDAB in the last 72 hours. Anemia Panel: No results for input(s): VITAMINB12, FOLATE, FERRITIN, TIBC, IRON, RETICCTPCT in the last 72 hours. Sepsis Labs:  Recent Labs Lab 02/18/16 1332 02/18/16 1733 02/18/16 1735 02/18/16 2035  PROCALCITON  --  2.42  --   --   LATICACIDVEN 4.53*  --  2.3* 1.4    Recent Results (from the past 240 hour(s))  Urine culture     Status: Abnormal   Collection Time: 02/18/16  1:05 PM  Result Value Ref Range Status   Specimen Description URINE, RANDOM  Final   Special Requests NONE  Final   Culture MULTIPLE SPECIES PRESENT, SUGGEST RECOLLECTION (A)  Final   Report Status 02/19/2016 FINAL  Final  Culture, blood (Routine x 2)     Status: Abnormal   Collection Time: 02/18/16  1:16 PM  Result Value Ref Range Status   Specimen Description BLOOD RIGHT ANTECUBITAL  Final   Special Requests BOTTLES DRAWN AEROBIC AND ANAEROBIC  10CC  Final   Culture  Setup Time   Final    GRAM NEGATIVE RODS AEROBIC BOTTLE ONLY CRITICAL RESULT CALLED TO, READ BACK BY AND VERIFIED WITH: Artelia Laroche PHARMD AT 9675 02/19/16 BY D. VANHOOK    Culture KLEBSIELLA OXYTOCA (A)  Final   Report Status 02/21/2016 FINAL  Final   Organism ID, Bacteria KLEBSIELLA OXYTOCA  Final      Susceptibility   Klebsiella oxytoca - MIC*    AMPICILLIN 16 RESISTANT Resistant     CEFAZOLIN 16 SENSITIVE Sensitive     CEFEPIME <=1 SENSITIVE Sensitive      CEFTAZIDIME <=1 SENSITIVE Sensitive     CEFTRIAXONE <=1 SENSITIVE Sensitive     CIPROFLOXACIN <=0.25 SENSITIVE Sensitive     GENTAMICIN <=1 SENSITIVE Sensitive     IMIPENEM 0.5 SENSITIVE Sensitive     TRIMETH/SULFA <=20 SENSITIVE Sensitive     AMPICILLIN/SULBACTAM <=2 SENSITIVE Sensitive     PIP/TAZO <=4 SENSITIVE Sensitive     * KLEBSIELLA OXYTOCA  Blood Culture ID Panel (Reflexed)     Status: Abnormal   Collection Time: 02/18/16  1:16 PM  Result Value Ref Range Status   Enterococcus species NOT DETECTED NOT DETECTED Final   Vancomycin resistance NOT DETECTED NOT DETECTED Final   Listeria monocytogenes NOT DETECTED NOT DETECTED Final   Staphylococcus species NOT DETECTED NOT DETECTED Final   Staphylococcus aureus NOT DETECTED NOT DETECTED Final   Methicillin resistance NOT DETECTED NOT DETECTED Final   Streptococcus species NOT DETECTED NOT DETECTED Final   Streptococcus agalactiae NOT DETECTED NOT DETECTED Final   Streptococcus pneumoniae NOT DETECTED NOT DETECTED Final   Streptococcus pyogenes NOT DETECTED NOT DETECTED Final   Acinetobacter baumannii NOT DETECTED NOT DETECTED Final   Enterobacteriaceae species DETECTED (A) NOT DETECTED Final    Comment: CRITICAL RESULT CALLED TO, READ BACK BY AND VERIFIED WITH: Artelia Laroche PHARMD AT 9163 02/19/16 BY D. VANHOOK    Enterobacter cloacae complex NOT DETECTED NOT DETECTED Final   Escherichia coli NOT DETECTED NOT DETECTED Final   Klebsiella oxytoca DETECTED (A) NOT DETECTED Final    Comment: CRITICAL RESULT CALLED TO, READ BACK BY AND VERIFIED WITH: Artelia Laroche PHARMD AT 0925 02/19/16 BY D. VANHOOK    Klebsiella pneumoniae NOT DETECTED NOT DETECTED Final   Proteus species NOT DETECTED NOT DETECTED Final   Serratia marcescens NOT DETECTED NOT DETECTED Final   Carbapenem resistance NOT DETECTED NOT DETECTED Final   Haemophilus influenzae NOT DETECTED NOT DETECTED Final   Neisseria meningitidis NOT DETECTED NOT DETECTED Final    Pseudomonas aeruginosa NOT DETECTED NOT DETECTED Final   Candida albicans NOT DETECTED NOT DETECTED Final   Candida glabrata NOT DETECTED NOT DETECTED Final   Candida krusei NOT DETECTED NOT DETECTED Final   Candida parapsilosis NOT DETECTED NOT DETECTED Final   Candida  tropicalis NOT DETECTED NOT DETECTED Final  Culture, blood (Routine x 2)     Status: None (Preliminary result)   Collection Time: 02/18/16  1:45 PM  Result Value Ref Range Status   Specimen Description BLOOD LEFT ANTECUBITAL  Final   Special Requests BOTTLES DRAWN AEROBIC AND ANAEROBIC  5CC  Final   Culture NO GROWTH 3 DAYS  Final   Report Status PENDING  Incomplete  MRSA PCR Screening     Status: None   Collection Time: 02/18/16  6:50 PM  Result Value Ref Range Status   MRSA by PCR NEGATIVE NEGATIVE Final    Comment:        The GeneXpert MRSA Assay (FDA approved for NASAL specimens only), is one component of a comprehensive MRSA colonization surveillance program. It is not intended to diagnose MRSA infection nor to guide or monitor treatment for MRSA infections.          Radiology Studies: No results found.      Scheduled Meds: . cefTRIAXone (ROCEPHIN)  IV  2 g Intravenous Q24H  . feeding supplement (ENSURE ENLIVE)  237 mL Oral BID BM  . levothyroxine  100 mcg Oral QAC breakfast  . mirtazapine  15 mg Oral QHS  . nicotine  14 mg Transdermal Daily  . sodium chloride flush  3 mL Intravenous Q12H   Continuous Infusions: . sodium chloride 75 mL/hr at 02/22/16 0529     LOS: 4 days    Time spent: 35 min    Breia Ocampo, MD [facility] Pager [phone]  If 7PM-7AM, please contact night-coverage [email or web address] Password [facility] 02/22/2016, 8:28 AM

## 2016-02-22 NOTE — Plan of Care (Signed)
Problem: Pain Managment: Goal: General experience of comfort will improve Outcome: Progressing Pt is currently pain free & has been for me thus far this shift.

## 2016-02-22 NOTE — Transfer of Care (Signed)
Immediate Anesthesia Transfer of Care Note  Patient: Taylor Bass  Procedure(s) Performed: Procedure(s): ENDOSCOPIC RETROGRADE CHOLANGIOPANCREATOGRAPHY (ERCP) (N/A)  Patient Location: PACU and Endoscopy Unit  Anesthesia Type:General  Level of Consciousness: awake and patient cooperative  Airway & Oxygen Therapy: Patient Spontanous Breathing and Patient connected to nasal cannula oxygen  Post-op Assessment: Report given to RN and Post -op Vital signs reviewed and stable  Post vital signs: Reviewed and stable  Last Vitals:  Vitals:   02/22/16 1153 02/22/16 1335  BP:  (!) 106/58  Pulse:  71  Resp:  13  Temp: 36.7 C     Last Pain:  Vitals:   02/22/16 0921  TempSrc: Oral  PainSc:          Complications: No apparent anesthesia complications

## 2016-02-23 ENCOUNTER — Encounter (HOSPITAL_COMMUNITY): Payer: Self-pay | Admitting: Gastroenterology

## 2016-02-23 DIAGNOSIS — R7989 Other specified abnormal findings of blood chemistry: Secondary | ICD-10-CM

## 2016-02-23 DIAGNOSIS — A4151 Sepsis due to Escherichia coli [E. coli]: Secondary | ICD-10-CM

## 2016-02-23 DIAGNOSIS — R197 Diarrhea, unspecified: Secondary | ICD-10-CM

## 2016-02-23 LAB — COMPREHENSIVE METABOLIC PANEL
ALBUMIN: 2.4 g/dL — AB (ref 3.5–5.0)
ALT: 67 U/L — AB (ref 14–54)
AST: 53 U/L — AB (ref 15–41)
Alkaline Phosphatase: 79 U/L (ref 38–126)
Anion gap: 6 (ref 5–15)
BILIRUBIN TOTAL: 0.7 mg/dL (ref 0.3–1.2)
CO2: 26 mmol/L (ref 22–32)
CREATININE: 0.64 mg/dL (ref 0.44–1.00)
Calcium: 8.3 mg/dL — ABNORMAL LOW (ref 8.9–10.3)
Chloride: 108 mmol/L (ref 101–111)
GFR calc Af Amer: >60 mL/min (ref >60)
GLUCOSE: 83 mg/dL (ref 65–99)
Potassium: 3.9 mmol/L (ref 3.5–5.1)
Sodium: 140 mmol/L (ref 135–145)
TOTAL PROTEIN: 6.1 g/dL — AB (ref 6.5–8.1)

## 2016-02-23 LAB — CBC WITH DIFFERENTIAL/PLATELET
BASOS ABS: 0 10*3/uL (ref 0.0–0.1)
Basophils Relative: 0 %
EOS ABS: 0.2 10*3/uL (ref 0.0–0.7)
EOS PCT: 2 %
HCT: 30.5 % — ABNORMAL LOW (ref 36.0–46.0)
Hemoglobin: 9.6 g/dL — ABNORMAL LOW (ref 12.0–15.0)
LYMPHS ABS: 1.5 10*3/uL (ref 0.7–4.0)
Lymphocytes Relative: 23 %
MCH: 29.8 pg (ref 26.0–34.0)
MCHC: 31.5 g/dL (ref 30.0–36.0)
MCV: 94.7 fL (ref 78.0–100.0)
MONO ABS: 0.4 10*3/uL (ref 0.1–1.0)
Monocytes Relative: 6 %
Neutro Abs: 4.4 10*3/uL (ref 1.7–7.7)
Neutrophils Relative %: 69 %
PLATELETS: 107 10*3/uL — AB (ref 150–400)
RBC: 3.22 MIL/uL — AB (ref 3.87–5.11)
RDW: 15.5 % (ref 11.5–15.5)
WBC: 6.4 10*3/uL (ref 4.0–10.5)

## 2016-02-23 LAB — C DIFFICILE QUICK SCREEN W PCR REFLEX
C DIFFICLE (CDIFF) ANTIGEN: NEGATIVE
C Diff interpretation: NOT DETECTED
C Diff toxin: NEGATIVE

## 2016-02-23 LAB — CULTURE, BLOOD (ROUTINE X 2): CULTURE: NO GROWTH

## 2016-02-23 MED ORDER — LOPERAMIDE HCL 2 MG PO CAPS
2.0000 mg | ORAL_CAPSULE | ORAL | Status: DC | PRN
  Administered 2016-02-23: 2 mg via ORAL
  Filled 2016-02-23: qty 1

## 2016-02-23 NOTE — Progress Notes (Signed)
Nutrition Follow-up  DOCUMENTATION CODES:   Not applicable  INTERVENTION:  Continue Ensure Enlive po BID, each supplement provides 350 kcal and 20 grams of protein.  Encourage adequate PO intake.   NUTRITION DIAGNOSIS:   Predicted suboptimal nutrient intake related to poor appetite as evidenced by per patient/family report; improving  GOAL:   Patient will meet greater than or equal to 90% of their needs; met  MONITOR:   PO intake, Supplement acceptance, Labs, Weight trends, Skin, I & O's  REASON FOR ASSESSMENT:   Consult Assessment of nutrition requirement/status  ASSESSMENT:   Danay L Gunnells is a very pleasant 82 y.o. female with medical history significant for hypertension, tobacco use, presents to the emergency department from home with the chief complaint of fever and chills. Initial evaluation reveals sepsis with unknown source.  Procedure (7/27): ENDOSCOPIC RETROGRADE CHOLANGIOPANCREATOGRAPHY (ERCP)  Meal completion has been 100% prior to procedure. Pt has been tolerating her diet today. Pt currently has Ensure and has been consuming them. RD to continue with current orders. Labs and medications reviewed.   Diet Order:  Diet Heart Room service appropriate? Yes; Fluid consistency: Thin  Skin:  Reviewed, no issues  Last BM:  7/28  Height:   Ht Readings from Last 1 Encounters:  02/18/16 _0  (1.651 m)    Weight:   Wt Readings from Last 1 Encounters:  02/22/16 110 lb 6.4 oz (50.1 kg)    Ideal Body Weight:  56.8 kg  BMI:  Body mass index is 18.37 kg/m.  Estimated Nutritional Needs:   Kcal:  1600-1800  Protein:  70-80 grams  Fluid:  1.6 - 1.8 L/day  EDUCATION NEEDS:   Education needs addressed  Corrin Parker, MS, RD, LDN Pager # [phone] After hours/ weekend pager # [phone]

## 2016-02-23 NOTE — Progress Notes (Signed)
Patient ID: Taylor Bass, female   DOB: 1934/02/10, 82 y.o.   MRN: [id number] [facility] Progress Note  Taylor Bass 82 y.o. 1934-06-08   Subjective: Feels good. Tolerating diet. Sitting in bedside chair.  Objective: Vital signs: Vitals:   02/23/16 0509 02/23/16 0900  BP: 110/68 137/66  Pulse: 73 76  Resp: 19 18  Temp: 97.8 F (36.6 C) 98.9 F (37.2 C)    Physical Exam: Gen: alert, elderly, thin, no acute distress  CV: RRR Chest: CTA B Abd: soft, nontender, nondistended, +BS  Lab Results:  Recent Labs  02/22/16 0521 02/23/16 0620  NA 140 140  K 4.0 3.9  CL 107 108  CO2 25 26  GLUCOSE 77 83  BUN 6 <5*  CREATININE 0.66 0.64  CALCIUM 8.8* 8.3*    Recent Labs  02/22/16 0521 02/23/16 0620  AST 39 53*  ALT 72* 67*  ALKPHOS 89 79  BILITOT 0.5 0.7  PROT 6.7 6.1*  ALBUMIN 2.5* 2.4*    Recent Labs  02/22/16 0521 02/23/16 0620  WBC 7.2 6.4  NEUTROABS  --  4.4  HGB 11.9* 9.6*  HCT 35.8* 30.5*  MCV 92.7 94.7  PLT 105* 107*      Assessment/Plan: Choledocholithiasis removed on ERCP yesterday. Doing well post-procedure. LFTs near normal. Stable for d/c today from GI standpoint. No f/u with GI needed. Will sign off. Call if questions.   Kwadwo Taras C. 02/23/2016, 11:05 AM  Pager [phone]  If no answer or after 5 PM call [phone]

## 2016-02-23 NOTE — Op Note (Addendum)
[facility] Patient Name: Taylor Bass Procedure Date : 02/22/2016 MRN: [id number] Attending MD: Vida Rigger , MD Date of Birth: 05/16/1934 CSN: [id number] Age: 82 Admit Type: Inpatient Procedure:                ERCP Indications:              Bile duct stone(s)on MRCP Providers:                Will Bonnet RN, RN, Beryle Beams, Technician,                            Vida Rigger, MD Referring MD:              Medicines:                General Anesthesia Complications:            No immediate complications. Estimated Blood Loss:     Estimated blood loss: very minimal from balloon                            withdrawal Procedure:                Pre-Anesthesia Assessment:                           - Prior to the procedure, a History and Physical                            was performed, and patient medications and                            allergies were reviewed. The patient's tolerance of                            previous anesthesia was also reviewed. The risks                            and benefits of the procedure and the sedation                            options and risks were discussed with the patient.                            All questions were answered, and informed consent                            was obtained. Prior Anticoagulants: The patient has                            taken no previous anticoagulant or antiplatelet                            agents. ASA Grade Assessment: II - A patient with                            mild systemic disease.  After reviewing the risks                            and benefits, the patient was deemed in                            satisfactory condition to undergo the procedure.                           After obtaining informed consent, the scope was                            passed under direct vision. Throughout the                            procedure, the patient's blood pressure, pulse, and         oxygen saturations were monitored continuously. The                            [id number]) scope was introduced through                            the mouth, and used to inject contrast into and                            used to locate the major and minor papilla. The                            ERCP was accomplished without difficulty. The                            patient tolerated the procedure well. Scope In: Scope Out: Findings:      The major papilla was located partially within a diverticulum. The major       papilla was normal. The minor papilla was normal. using a triple-lumen       sphincterotome loaded with the JAG Jagwire deep selective cannulation       was obtained and there was no PD injection or wire advancement       throughout the procedure and on initial cholangiogram we thought we saw       a few small stones in the distal and mid CBD and we proceeded with the       Biliary sphincterotomy was made with a Hydratome sphincterotome using       ERBE electrocautery in the customary fashion until we had adequate       biliary drainage and could get the fully bowed sphincterotome easily in       and out of the duct however there was room to increase the       sphincterotomy if needed but based on positioning we elected to hold       off. There was no post-sphincterotomy bleeding. Choledocholithiasis was       found in a nondilated duct. The biliary tree was swept with a 12 mm       adjustable balloon starting at the upper third of the main bile duct.  One stone was removed. No stones remained. multiple pass-throughs were       done without any resistance and very minimal bleeding from withdrawing       the balloon and we even inflated the balloon to 13.5 on the last       pull-through and also proceed with an occlusion cholangiogram which was       okay Nothing further was found. and the patient had adequate drainage       and the wire and the balloon were  removed and the scope was removed and       the patient tolerated the procedure well there was no obvious immediate       complication Impression:               - The major papilla was located partially within a                            diverticulum.                           - The major papilla and did appear normal                           - The minor papilla appeared normal.                           - Choledocholithiasis was found. Complete removal                            was accomplished by biliary sphincterotomy and                            balloon extraction as above.                           - A biliary sphincterotomy was performed.                           - The biliary tree was swept and nothing was found                            at the end of the procedure and there was adequate                            biliary drainage. and no pancreatic duct injection                            or wire advancement was done during the procedure Recommendation:           - Avoid aspirin and nonsteroidal anti-inflammatory                            medicines for 5 days.                           - Clear liquid diet today. slowly advance tomorrow  if doing well                           - Continue present medications.                           - Return to GI clinic PRN.                           - Telephone GI clinic if symptomatic PRN. Procedure Code(s):        --- Professional ---                           [phone], Esophagogastroduodenoscopy, flexible,                            transoral; diagnostic, including collection of                            specimen(s) by brushing or washing, when performed                            (separate procedure) Diagnosis Code(s):        --- Professional ---                           K80.50, Calculus of bile duct without cholangitis                            or cholecystitis without obstruction CPT copyright 2016  American Medical Association. All rights reserved. The codes documented in this report are preliminary and upon coder review may  be revised to meet current compliance requirements. Vida Rigger, MD 02/22/2016 1:17:17 PM Number of Addenda: 1 Addendum Number: 1   Addendum Date: 02/23/2016 9:26:03 AM      This note was electronically signed on 02/22/16 at 1:17:17PM Vida Rigger, MD 02/23/2016 9:26:36 AM

## 2016-02-23 NOTE — Clinical Social Work Note (Signed)
CSW received consult - admitted from any facility. Chart reviewed and no mention of patient coming to hospital from a facility. Nurse case manager met with patient and family on 7/27 and [facility] services have been set-up. CSW signing off, however please reconsult if other SW services needed before discharge.  Taylor Bass, MSW, LCSW Licensed Clinical Social Worker [place] [phone]

## 2016-02-23 NOTE — Progress Notes (Signed)
Physical Therapy Treatment Patient Details Name: Taylor Bass MRN: [id number] DOB: 1934-05-03 Today's Date: 02/23/2016    History of Present Illness Pt is a 82 y/o F admitted on 7/23 with c/o fevers, chills, found to be febrile with temperature of 102, hypotensive. Admitted for sepsis.  No clear source of infection.     PT Comments    Patient progressing well towards PT goals. Balance improved using RW vs cane today but pt requires Min A for balance at times esp during turns and for RW proximity. Recommend use of RW at home for safety. Pt agreeable. Pt quick to sit on surfaces without being full in the best position putting pt at increased risk for falls. Education provided on safety for all mobility. Will continue to follow.  Follow Up Recommendations  Home health PT;Supervision/Assistance - 24 hour     Equipment Recommendations  Rolling walker with 5" wheels    Recommendations for Other Services       Precautions / Restrictions Precautions Precautions: Fall Restrictions Weight Bearing Restrictions: No    Mobility  Bed Mobility Overal bed mobility: Needs Assistance Bed Mobility: Supine to Sit     Supine to sit: Modified independent (Device/Increase time);HOB elevated     General bed mobility comments: No assist needed. Increased time and use of rail.   Transfers Overall transfer level: Needs assistance Equipment used: Rolling walker (2 wheeled) Transfers: Stand Pivot Transfers Sit to Stand: Min guard Stand pivot transfers: Min guard       General transfer comment: Multiple attempts to stand from EOB pulling up on RW despite cues but no assist needed. Transferred to chair post ambulation bout. SPT bed to/from [facility] Min guard assist with uncontrollred descent onto all surfaces.  Ambulation/Gait Ambulation/Gait assistance: Min guard;Min assist Ambulation Distance (Feet): 200 Feet Assistive device: Rolling walker (2 wheeled) Gait Pattern/deviations: Step-through  pattern;Decreased stride length;Shuffle;Trunk flexed;Narrow base of support   Gait velocity interpretation: <1.8 ft/sec, indicative of risk for recurrent falls General Gait Details: Flexed posture with short shuffling steps. Using RW today for support, cues for RW proximity/positioning as pt pushing RW too far anterior. Min A during turns.   Stairs            Wheelchair Mobility    Modified Rankin (Stroke Patients Only)       Balance Overall balance assessment: Needs assistance Sitting-balance support: Feet supported;No upper extremity supported Sitting balance-Leahy Scale: Good     Standing balance support: During functional activity Standing balance-Leahy Scale: Fair                      Cognition Arousal/Alertness: Awake/alert Behavior During Therapy: Flat affect Overall Cognitive Status: Within Functional Limits for tasks assessed                      Exercises      General Comments General comments (skin integrity, edema, etc.): Pt incontinent of urine during ambulation. "I keep on needing to pee."      Pertinent Vitals/Pain Pain Assessment: No/denies pain    Home Living                      Prior Function            PT Goals (current goals can now be found in the care plan section) Progress towards PT goals: Progressing toward goals    Frequency  Min 3X/week    PT Plan Current plan remains  appropriate;Equipment recommendations need to be updated    Co-evaluation             End of Session Equipment Utilized During Treatment: Gait belt Activity Tolerance: Patient tolerated treatment well Patient left: in chair;with call bell/phone within reach;with chair alarm set     Time: [phone] PT Time Calculation (min) (ACUTE ONLY): 15 min  Charges:  $Gait Training: 8-22 mins                    G Codes:      Colyn Miron A Kael Keetch 02/23/2016, 9:46 AM  Mylo Red, PT, DPT [phone]

## 2016-02-23 NOTE — Progress Notes (Signed)
Patient ID: Taylor Bass, female   DOB: Jan 04, 1934, 82 y.o.   MRN: [id number]                                                                PROGRESS NOTE                                                                                                                                                                                                             Patient Demographics:    Taylor Bass, is a 82 y.o. female, DOB - 1934/05/11, WUJ:[id number]  Admit date - 02/18/2016   Admitting Physician Costin Otelia Sergeant, MD  Outpatient Primary MD for the patient is OSEI-BONSU,GEORGE, MD  LOS - 5  Outpatient Specialists:  Chief Complaint  Patient presents with  . Fever       Brief Narrative  82 year old female with a past medical history of hypertension, thrombus cytopenia, admitted to the medicine service on 02/18/2016 with complaints of fevers, chills, found to be febrile with temperature 102, hypotensive with systolic blood pressures in the 70s and 80s and a lactic acid of 4.5. Initial workup did not show obvious source of infection as chest x-ray and urinalysis were unremarkable. She was started on broad-spectrum IV antibiotic therapy with vancomycin and Zosyn. On the following day microbiology reported one out of 2 blood cultures positive for gram-negative rods. Lab work revealed elevated transaminases that was further worked up with right upper quadrant ultrasound. Did not show evidence of acute cholecystitis however there was, bile duct dilatation to 8 mm. This was further worked up with MRCP (02/20/2016)=> choledocholithiasis. , on rocephin for Klebsiella + blood culture   Subjective:    [name] today has been feeling fine,  Afebrile overnite.  + diarrhea x3 today.  No headache, No chest pain, No abdominal pain - No Nausea, No new weakness tingling or numbness, No Cough - SOB.    Assessment  & Plan :    Principal Problem:   Sepsis (HCC) Active Problems:   Elevated AST (SGOT)  Elevated ALT measurement   Thrombocytopenia (HCC)   Fever   Lactic acidosis   Tobacco abuse   Hypotension   Hypothyroidism   1. Diarrhea Check stool for fecal leukocytes, culture c. Diff.  Immodium prn  2.  Biliary  Sepsis , blood culture shows Klebsiella oxytoca Presented with systolic blood pressures in the 80s, elevated lactic acid level of 4.5, temperature 102.4, heart rate of 101 with blood cultures growing gram-negative rods -Having elevated transaminases she had a right upper quadrant ultrasound that did not show evidence for acute cholecystitis. Radiology did reports dilated common bile duct measuring up to 8 mm. MRCP showed CBD 1 cm with small distal CBD stones and mild peripancreatic fluid at the tail. Lipase not checked on admit but 50 today-  continue IV fluid resuscitation, Vanco/zosyn  => rocephin for Klebsiella + blood culture -Lactic acid levels trended down to 1.4   antibiotic regimen narrowed to ceftriaxone   ERCP  Thursday 02/22/16. Appreciate GI input   3.  Elevated transaminases Likely secondary to choledocholithiasis.  Holding lipitor, consider stopping remeron, rocephin Check cmp in am  4.  History of thrombocytopenia -Since 2013 platelet count has fluctuated between 90 and 151, stable check cbc in am  5.  History of hypertension -She had been on Hyzaar in the outpatient setting which was discontinued due to sepsis and hypotension  6.  Hypothyroidism -Continue Synthroid 100 g by mouth daily  -Labs showed TSH of 0.676      Code Status : FULL CODE   Family Communication  :   Disposition Plan  : home  Barriers For Discharge :   Consults  :  GI  Procedures  : ERCP 02/22/2016  DVT Prophylaxis  :   SCDs   Lab Results  Component Value Date   PLT 107 (L) 02/23/2016    Antibiotics  :  rocephin  Anti-infectives    Start     Dose/Rate Route Frequency Ordered Stop   02/20/16 0900  cefTRIAXone (ROCEPHIN) 2 g in dextrose 5 % 50 mL IVPB      2 g 100 mL/hr over 30 Minutes Intravenous Every 24 hours 02/20/16 0818     02/19/16 1400  vancomycin (VANCOCIN) IVPB 750 mg/150 ml premix  Status:  Discontinued     750 mg 150 mL/hr over 60 Minutes Intravenous Every 24 hours 02/18/16 1413 02/19/16 1006   02/18/16 2100  piperacillin-tazobactam (ZOSYN) IVPB 3.375 g  Status:  Discontinued     3.375 g 12.5 mL/hr over 240 Minutes Intravenous Every 8 hours 02/18/16 1413 02/20/16 0818   02/18/16 1530  piperacillin-tazobactam (ZOSYN) IVPB 3.375 g  Status:  Discontinued     3.375 g 100 mL/hr over 30 Minutes Intravenous  Once 02/18/16 1528 02/18/16 1532   02/18/16 1530  vancomycin (VANCOCIN) IVPB 1000 mg/200 mL premix  Status:  Discontinued     1,000 mg 200 mL/hr over 60 Minutes Intravenous  Once 02/18/16 1528 02/18/16 1532   02/18/16 1430  vancomycin (VANCOCIN) 1,250 mg in sodium chloride 0.9 % 250 mL IVPB     1,250 mg 166.7 mL/hr over 90 Minutes Intravenous  Once 02/18/16 1413 02/18/16 1639   02/18/16 1415  piperacillin-tazobactam (ZOSYN) IVPB 3.375 g     3.375 g 100 mL/hr over 30 Minutes Intravenous  Once 02/18/16 1413 02/18/16 1505        Objective:   Vitals:   02/22/16 1615 02/22/16 2058 02/23/16 0509 02/23/16 0900  BP: (!) 127/59 (!) 125/58 110/68 137/66  Pulse: 65 69 73 76  Resp: 17 18 19 18   Temp: 97.7 F (36.5 C) 98.3 F (36.8 C) 97.8 F (36.6 C) 98.9 F (37.2 C)  TempSrc: Oral Oral Oral Oral  SpO2: 99% 97% 99% 100%  Weight:  50.1 kg (110 lb 6.4 oz)    Height:        Wt Readings from Last 3 Encounters:  02/22/16 50.1 kg (110 lb 6.4 oz)     Intake/Output Summary (Last 24 hours) at 02/23/16 1309 Last data filed at 02/23/16 0930  Gross per 24 hour  Intake             4105 ml  Output              200 ml  Net             3905 ml     Physical Exam  Awake Alert, Oriented X 3, No new F.N deficits, Normal affect [place].AT,PERRAL Supple Neck,No JVD, No cervical lymphadenopathy appriciated.  Symmetrical Chest wall  movement, Good air movement bilaterally, CTAB RRR,No Gallops,Rubs or new Murmurs, No Parasternal Heave +ve B.Sounds, Abd Soft, No tenderness, No organomegaly appriciated, No rebound - guarding or rigidity. No Cyanosis, Clubbing or edema, No new Rash or bruise  No palmar erythema, no asterixis    Data Review:    CBC  Recent Labs Lab 02/18/16 1319 02/19/16 0247 02/20/16 0224 02/21/16 0403 02/22/16 0521 02/23/16 0620  WBC 9.1 7.9 5.9 6.8 7.2 6.4  HGB 12.6 10.1* 10.4* 11.0* 11.9* 9.6*  HCT 39.0 32.5* 33.4* 35.0* 35.8* 30.5*  PLT 108* 90* 82* 96* 105* 107*  MCV 95.6 95.6 95.4 95.1 92.7 94.7  MCH 30.9 29.7 29.7 29.9 30.8 29.8  MCHC 32.3 31.1 31.1 31.4 33.2 31.5  RDW 14.9 14.8 14.8 14.8 15.2 15.5  LYMPHSABS 1.3  --   --   --   --  1.5  MONOABS 0.3  --   --   --   --  0.4  EOSABS 0.1  --   --   --   --  0.2  BASOSABS 0.0  --   --   --   --  0.0    Chemistries   Recent Labs Lab 02/19/16 0247 02/20/16 0224 02/21/16 0403 02/22/16 0521 02/23/16 0620  NA 138 140 138 140 140  K 3.6 3.8 4.0 4.0 3.9  CL 108 111 106 107 108  CO2 27 24 28 25 26   GLUCOSE 83 82 89 77 83  BUN 15 10 11 6  <5*  CREATININE 0.91 0.77 0.86 0.66 0.64  CALCIUM 8.1* 8.1* 8.6* 8.8* 8.3*  AST 213* 97* 60* 39 53*  ALT 172* 116* 90* 72* 67*  ALKPHOS 96 86 97 89 79  BILITOT 0.6 0.4 0.4 0.5 0.7   ------------------------------------------------------------------------------------------------------------------ No results for input(s): CHOL, HDL, LDLCALC, TRIG, CHOLHDL, LDLDIRECT in the last 72 hours.  No results found for: HGBA1C ------------------------------------------------------------------------------------------------------------------ No results for input(s): TSH, T4TOTAL, T3FREE, THYROIDAB in the last 72 hours.  Invalid input(s): FREET3 ------------------------------------------------------------------------------------------------------------------ No results for input(s): VITAMINB12, FOLATE,  FERRITIN, TIBC, IRON, RETICCTPCT in the last 72 hours.  Coagulation profile  Recent Labs Lab 02/18/16 1733  INR 1.22    No results for input(s): DDIMER in the last 72 hours.  Cardiac Enzymes No results for input(s): CKMB, TROPONINI, MYOGLOBIN in the last 168 hours.  Invalid input(s): CK ------------------------------------------------------------------------------------------------------------------ No results found for: BNP  Inpatient Medications  Scheduled Meds: . cefTRIAXone (ROCEPHIN)  IV  2 g Intravenous Q24H  . feeding supplement (ENSURE ENLIVE)  237 mL Oral BID BM  . levothyroxine  100 mcg Oral QAC breakfast  . mirtazapine  15 mg Oral QHS  . nicotine  14 mg Transdermal Daily  . sodium  chloride flush  3 mL Intravenous Q12H   Continuous Infusions: . sodium chloride 75 mL/hr at 02/23/16 0344   PRN Meds:.acetaminophen **OR** acetaminophen, albuterol, ondansetron **OR** ondansetron (ZOFRAN) IV  Micro Results Recent Results (from the past 240 hour(s))  Urine culture     Status: Abnormal   Collection Time: 02/18/16  1:05 PM  Result Value Ref Range Status   Specimen Description URINE, RANDOM  Final   Special Requests NONE  Final   Culture MULTIPLE SPECIES PRESENT, SUGGEST RECOLLECTION (A)  Final   Report Status 02/19/2016 FINAL  Final  Culture, blood (Routine x 2)     Status: Abnormal   Collection Time: 02/18/16  1:16 PM  Result Value Ref Range Status   Specimen Description BLOOD RIGHT ANTECUBITAL  Final   Special Requests BOTTLES DRAWN AEROBIC AND ANAEROBIC  10CC  Final   Culture  Setup Time   Final    GRAM NEGATIVE RODS AEROBIC BOTTLE ONLY CRITICAL RESULT CALLED TO, READ BACK BY AND VERIFIED WITH: Artelia Laroche PHARMD AT 1610 02/19/16 BY D. VANHOOK    Culture KLEBSIELLA OXYTOCA (A)  Final   Report Status 02/21/2016 FINAL  Final   Organism ID, Bacteria KLEBSIELLA OXYTOCA  Final      Susceptibility   Klebsiella oxytoca - MIC*    AMPICILLIN 16 RESISTANT Resistant       CEFAZOLIN 16 SENSITIVE Sensitive     CEFEPIME <=1 SENSITIVE Sensitive     CEFTAZIDIME <=1 SENSITIVE Sensitive     CEFTRIAXONE <=1 SENSITIVE Sensitive     CIPROFLOXACIN <=0.25 SENSITIVE Sensitive     GENTAMICIN <=1 SENSITIVE Sensitive     IMIPENEM 0.5 SENSITIVE Sensitive     TRIMETH/SULFA <=20 SENSITIVE Sensitive     AMPICILLIN/SULBACTAM <=2 SENSITIVE Sensitive     PIP/TAZO <=4 SENSITIVE Sensitive     * KLEBSIELLA OXYTOCA  Blood Culture ID Panel (Reflexed)     Status: Abnormal   Collection Time: 02/18/16  1:16 PM  Result Value Ref Range Status   Enterococcus species NOT DETECTED NOT DETECTED Final   Vancomycin resistance NOT DETECTED NOT DETECTED Final   Listeria monocytogenes NOT DETECTED NOT DETECTED Final   Staphylococcus species NOT DETECTED NOT DETECTED Final   Staphylococcus aureus NOT DETECTED NOT DETECTED Final   Methicillin resistance NOT DETECTED NOT DETECTED Final   Streptococcus species NOT DETECTED NOT DETECTED Final   Streptococcus agalactiae NOT DETECTED NOT DETECTED Final   Streptococcus pneumoniae NOT DETECTED NOT DETECTED Final   Streptococcus pyogenes NOT DETECTED NOT DETECTED Final   Acinetobacter baumannii NOT DETECTED NOT DETECTED Final   Enterobacteriaceae species DETECTED (A) NOT DETECTED Final    Comment: CRITICAL RESULT CALLED TO, READ BACK BY AND VERIFIED WITH: Artelia Laroche PHARMD AT 9604 02/19/16 BY D. VANHOOK    Enterobacter cloacae complex NOT DETECTED NOT DETECTED Final   Escherichia coli NOT DETECTED NOT DETECTED Final   Klebsiella oxytoca DETECTED (A) NOT DETECTED Final    Comment: CRITICAL RESULT CALLED TO, READ BACK BY AND VERIFIED WITH: Artelia Laroche PHARMD AT 0925 02/19/16 BY D. VANHOOK    Klebsiella pneumoniae NOT DETECTED NOT DETECTED Final   Proteus species NOT DETECTED NOT DETECTED Final   Serratia marcescens NOT DETECTED NOT DETECTED Final   Carbapenem resistance NOT DETECTED NOT DETECTED Final   Haemophilus influenzae NOT DETECTED NOT  DETECTED Final   Neisseria meningitidis NOT DETECTED NOT DETECTED Final   Pseudomonas aeruginosa NOT DETECTED NOT DETECTED Final   Candida albicans NOT DETECTED NOT DETECTED Final  Candida glabrata NOT DETECTED NOT DETECTED Final   Candida krusei NOT DETECTED NOT DETECTED Final   Candida parapsilosis NOT DETECTED NOT DETECTED Final   Candida tropicalis NOT DETECTED NOT DETECTED Final  Culture, blood (Routine x 2)     Status: None (Preliminary result)   Collection Time: 02/18/16  1:45 PM  Result Value Ref Range Status   Specimen Description BLOOD LEFT ANTECUBITAL  Final   Special Requests BOTTLES DRAWN AEROBIC AND ANAEROBIC  5CC  Final   Culture NO GROWTH 4 DAYS  Final   Report Status PENDING  Incomplete  MRSA PCR Screening     Status: None   Collection Time: 02/18/16  6:50 PM  Result Value Ref Range Status   MRSA by PCR NEGATIVE NEGATIVE Final    Comment:        The GeneXpert MRSA Assay (FDA approved for NASAL specimens only), is one component of a comprehensive MRSA colonization surveillance program. It is not intended to diagnose MRSA infection nor to guide or monitor treatment for MRSA infections.     Radiology Reports Dg Chest 2 View  Result Date: 02/18/2016 CLINICAL DATA:  Healing bed, fever, chills EXAM: CHEST  2 VIEW COMPARISON:  10/09/2011 FINDINGS: Lungs are clear.  No pleural effusion or pneumothorax. The heart is normal in size. Mild degenerative changes of the visualized thoracolumbar spine. IMPRESSION: No evidence of acute cardiopulmonary disease. Electronically Signed   By: Charline Bills M.D.   On: 02/18/2016 15:02  Mr Abdomen Mrcp Wo Cm  Result Date: 02/20/2016 CLINICAL DATA:  Cholecystitis, preop laparoscopic cholecystectomy, evaluate for CBD stone EXAM: MRI ABDOMEN WITHOUT CONTRAST  (INCLUDING MRCP) TECHNIQUE: Multiplanar multisequence MR imaging of the abdomen was performed. Heavily T2-weighted images of the biliary and pancreatic ducts were obtained, and  three-dimensional MRCP images were rendered by post processing. COMPARISON:  Right upper quadrant ultrasound dated 02/18/2016 FINDINGS: Severely motion degraded images. Lower chest:  Lung bases are clear. Hepatobiliary: 10 mm cyst inferiorly in segment 6 (series 3/ image 22). No suspicious hepatic lesions. Layering small gallstones (series 3/ image 26), without associated gallbladder wall thickening or inflammatory changes. Mild central intrahepatic ductal dilatation. Dilated common duct, measuring 10 mm (series 11/image 28). Stacked small distal CBD stones (series 5/ image 16), poorly visualized due motion degradation. Pancreas: Mild peripancreatic fluid along the pancreatic tail (series 3/image 18), nonspecific. Correlate for acute pancreatitis. No associated pancreatic ductal dilatation. Spleen: Within normal limits. Adrenals/Urinary Tract: Adrenal glands are within normal limits. Kidneys are within normal limits, noting nonspecific perinephric fluid bilaterally. No hydronephrosis. Stomach/Bowel: Stomach is within normal limits. Visualized bowel is unremarkable. Vascular/Lymphatic: No evidence of abdominal aortic aneurysm. No suspicious abdominal lymphadenopathy. Other: No abdominal ascites. Musculoskeletal: No focal osseous lesions. IMPRESSION: Severely motion degraded images. Cholelithiasis, without associated inflammatory changes to suggest acute cholecystitis. Mild intrahepatic and extrahepatic ductal dilatation. Common duct measures 10 mm. Choledocholithiasis, with small distal CBD stones. ERCP is suggested. Mild peripancreatic fluid along the pancreatic tail, nonspecific. Correlate for acute pancreatitis. Electronically Signed   By: Charline Bills M.D.   On: 02/20/2016 07:43  Dg Ercp Biliary & Pancreatic Ducts  Result Date: 02/22/2016 CLINICAL DATA:  Bile duct calculus. EXAM: ERCP . TECHNIQUE: Multiple spot images obtained with the fluoroscopic device and submitted for interpretation post-procedure.  FLUOROSCOPY TIME:  Fluoroscopy Time:  6 minutes 1 second. Number of Acquired Images:  2. COMPARISON:  MRI of February 19, 2016. FINDINGS: Two fluoroscopic images were obtained during ERCP. These images demonstrate significant dilatation of  the common bile duct and left intrahepatic duct. IMPRESSION: Severe dilatation of common bile duct and left intrahepatic duct. These images were submitted for radiologic interpretation only. Please see the procedural report for the amount of contrast and the fluoroscopy time utilized. Electronically Signed   By: Lupita Raider, M.D.   On: 02/22/2016 14:17  US Abdomen Limited Ruq  Result Date: 02/18/2016 CLINICAL DATA:  Patient with elevated LFTs. EXAM: US ABDOMEN LIMITED - RIGHT UPPER QUADRANT COMPARISON:  None. FINDINGS: Gallbladder: No gallstones or wall thickening visualized. No sonographic Murphy sign noted by sonographer. Common bile duct: Diameter: 8 mm Liver: Liver is increased in echogenicity. Within the left hepatic lobe there is a 1.1 x 0.9 x 1.1 cm cyst. IMPRESSION: No cholelithiasis or sonographic evidence for acute cholecystitis. Common bile duct is prominent for age measuring up to 8 mm. In the setting of abnormal LFTs, consider further evaluation with MRCP as clinically indicated. Hepatic steatosis. Electronically Signed   By: Annia Belt M.D.   On: 02/18/2016 20:20   Time Spent in minutes  30   Pearson Grippe M.D on 02/23/2016 at 1:09 PM  Between 7am to 7pm - Pager - [phone]  After 7pm go to [email or web address] - password [email or web address] [facility]  [facility] -  Office  [phone]

## 2016-02-24 DIAGNOSIS — K8050 Calculus of bile duct without cholangitis or cholecystitis without obstruction: Secondary | ICD-10-CM

## 2016-02-24 DIAGNOSIS — A419 Sepsis, unspecified organism: Secondary | ICD-10-CM

## 2016-02-24 LAB — COMPREHENSIVE METABOLIC PANEL
ALBUMIN: 2.4 g/dL — AB (ref 3.5–5.0)
ALK PHOS: 85 U/L (ref 38–126)
ALT: 57 U/L — AB (ref 14–54)
AST: 40 U/L (ref 15–41)
Anion gap: 7 (ref 5–15)
BILIRUBIN TOTAL: 0.3 mg/dL (ref 0.3–1.2)
BUN: 10 mg/dL (ref 6–20)
CO2: 23 mmol/L (ref 22–32)
CREATININE: 0.72 mg/dL (ref 0.44–1.00)
Calcium: 8.4 mg/dL — ABNORMAL LOW (ref 8.9–10.3)
Chloride: 108 mmol/L (ref 101–111)
GFR calc Af Amer: >60 mL/min (ref >60)
GFR calc non Af Amer: >60 mL/min (ref >60)
GLUCOSE: 75 mg/dL (ref 65–99)
POTASSIUM: 3.5 mmol/L (ref 3.5–5.1)
Sodium: 138 mmol/L (ref 135–145)
TOTAL PROTEIN: 6.2 g/dL — AB (ref 6.5–8.1)

## 2016-02-24 LAB — CBC
HEMATOCRIT: 29.8 % — AB (ref 36.0–46.0)
HEMOGLOBIN: 9.2 g/dL — AB (ref 12.0–15.0)
MCH: 29.5 pg (ref 26.0–34.0)
MCHC: 30.9 g/dL (ref 30.0–36.0)
MCV: 95.5 fL (ref 78.0–100.0)
Platelets: 107 10*3/uL — ABNORMAL LOW (ref 150–400)
RBC: 3.12 MIL/uL — AB (ref 3.87–5.11)
RDW: 15.6 % — AB (ref 11.5–15.5)
WBC: 8.1 10*3/uL (ref 4.0–10.5)

## 2016-02-24 MED ORDER — ALIGN PO CAPS: 1.0000 | ORAL_CAPSULE | Freq: Every day | ORAL | 0 refills | Status: AC

## 2016-02-24 MED ORDER — CEPHALEXIN 500 MG PO CAPS: 500.0000 mg | ORAL_CAPSULE | Freq: Four times a day (QID) | ORAL | 0 refills | Status: AC

## 2016-02-24 MED ORDER — NICOTINE 14 MG/24HR TD PT24: 14.0000 mg | MEDICATED_PATCH | Freq: Every day | TRANSDERMAL | 0 refills | Status: DC

## 2016-02-24 MED ORDER — LOPERAMIDE HCL 2 MG PO CAPS: 2.0000 mg | ORAL_CAPSULE | ORAL | 0 refills | Status: DC | PRN

## 2016-02-24 MED ORDER — ENSURE ENLIVE PO LIQD: 237.0000 mL | Freq: Two times a day (BID) | ORAL | 12 refills | Status: AC

## 2016-02-24 NOTE — Discharge Instructions (Signed)
F/u with your pcp in 2 weeks.

## 2016-02-24 NOTE — Discharge Summary (Signed)
Taylor Bass, is a 82 y.o. female  DOB January 05, 1934  MRN [id number].  Admission date:  02/18/2016  Admitting Physician  Leatha Gilding, MD  Discharge Date:  02/24/2016   Primary MD  Jackie Plum, MD  Recommendations for primary care physician for things to follow:   Sepsis secondary to Klebsiella   Rocephin (x5 days completed) Keflex 500mg  po qid x 2 days.   Abnormal lft secondary to choledocholithiasis S/p ERCP w sphincterotomy on 02/23/2016 Please f/u with GI [facility]) prn per ERCP note Avoid aspirin and nsaids x 3 days   Diarrhea c. Diff negative, improved.  Start Align 1 po qday x 84month  Abnormal lft Check cmp in 2 weeks  Hx of thrombocytopenia (plt 107, 02/24/2016) Check cbc in 2 weeks  Protein Calorie malnutrition Severe Please ensure 1 can bid   Admission Diagnosis  Elevated LFTs [R79.89] Essential hypertension [I10] Severe sepsis (HCC) [A41.9, R65.20] Hypothyroidism, unspecified hypothyroidism type [E03.9]   Discharge Diagnosis  Elevated LFTs [R79.89] Essential hypertension [I10] Severe sepsis (HCC) [A41.9, R65.20] Hypothyroidism, unspecified hypothyroidism type [E03.9]    Principal Problem:   Sepsis (HCC) Active Problems:   Elevated AST (SGOT)   Elevated ALT measurement   Thrombocytopenia (HCC)   Fever   Lactic acidosis   Tobacco abuse   Hypotension   Hypothyroidism   Diarrhea   Choledocholithiasis      Past Medical History:  Diagnosis Date  . Hypertension   . Hypothyroidism   . Sepsis (HCC)   . Thrombocytopenia (HCC)   . Tobacco use     Past Surgical History:  Procedure Laterality Date  . ERCP N/A 02/22/2016   Procedure: ENDOSCOPIC RETROGRADE CHOLANGIOPANCREATOGRAPHY (ERCP);  Surgeon: Vida Rigger, MD;  Location: [facility] ENDOSCOPY;  Service: Gastroenterology;  Laterality: N/A;       HPI  from the history and physical done on the day of  admission:     82 y.o. female with medical history significant for hypertension, tobacco use, presents to the emergency department from home with the chief complaint of fever and chills. Initial evaluation reveals sepsis with unknown source.  Information is obtained from the patient and the son who is at the bedside. She was in her usual state of health until this morning she developed sudden fever and "shaking all over. Associated symptoms include increased coughing according to the son particularly at night. She is a heavy smoker. She denies any headache dizziness chest pain palpitation nausea vomiting diarrhea dysuria hematuria frequency or urgency. She denies diarrhea constipation melena bright red blood per rectum. She denies lower extremity edema or arthropathy of. She does report decreased appetite with unintentional weight loss over the last several months.   ED Course: In the emergency department she's hypotensive with tachycardia with max temperature 102.4 orally. Provided with broad-spectrum antibiotics fluid resuscitation. At point of admission temperature 99.5 blood pressure 90/52 with a heart rate of 80. She's not hypoxic and nontoxic appearing      Hospital Course:  82 year old female with a past medical history of hypertension, thrombus cytopenia, admitted to the medicine service on 02/18/2016 with complaints of fevers, chills, found to be febrile with temperature 102, hypotensive with systolic blood pressures in the 70s and 80s and a lactic acid of 4.5. Initial workup did not show obvious source of infection as chest x-ray and urinalysis were unremarkable. She was started on broad-spectrum IV antibiotic therapy with vancomycin and Zosyn. On the following day microbiology reported one out of 2 blood cultures positive for gram-negative rods. Lab work revealed elevated transaminases that was further worked up with right upper quadrant ultrasound. Did not show evidence of acute  cholecystitis however there was, bile duct dilatation to 8 mm. This was further worked up with MRCP (02/20/2016) => choledocholithiasis. S/p ERCP with sphincterotomy on 02/22/2016 [facility]), on rocephin for Klebsiella + blood culture.  Pt afebrile , had slight diarrhea on 02/23/2016, improving today, pt requesting discharge to home.    Follow UP  Follow-up Information    OSEI-BONSU,GEORGE, MD Follow up in 2 week(s).   Specialty:  Internal Medicine Contact information: [address] [place] [address] [phone]            Consults obtained - GI  Discharge Condition: stable  Diet and Activity recommendation: See Discharge Instructions below  Discharge Instructions    Please follow up with PCP in 2 weeks   Discharge Medications       Medication List    TAKE these medications   atorvastatin 80 MG tablet Commonly known as:  LIPITOR Take 80 mg by mouth every evening.   bifidobacterium infantis capsule Take 1 capsule by mouth daily.   cephALEXin 500 MG capsule Commonly known as:  KEFLEX Take 1 capsule (500 mg total) by mouth 4 (four) times daily.   diphenhydrAMINE 25 MG tablet Commonly known as:  BENADRYL Take 1 tablet (25 mg total) by mouth every 6 (six) hours as needed for itching. X   feeding supplement (ENSURE ENLIVE) Liqd Take 237 mLs by mouth 2 (two) times daily between meals.   levothyroxine 100 MCG tablet Commonly known as:  SYNTHROID, LEVOTHROID Take 100 mcg by mouth daily.   loperamide 2 MG capsule Commonly known as:  IMODIUM Take 1 capsule (2 mg total) by mouth as needed for diarrhea or loose stools.   losartan-hydrochlorothiazide 100-25 MG tablet Commonly known as:  HYZAAR Take 1 tablet by mouth daily.   mirtazapine 15 MG tablet Commonly known as:  REMERON Take 15 mg by mouth at bedtime.   nicotine 14 mg/24hr patch Commonly known as:  NICODERM CQ - dosed in mg/24 hours Place 1 patch (14 mg total) onto the skin daily.     potassium chloride 10 MEQ tablet Commonly known as:  K-DUR Take 10 mEq by mouth daily.   PROAIR HFA 108 (90 Base) MCG/ACT inhaler Generic drug:  albuterol Inhale 2 puffs into the lungs 4 (four) times daily as needed for wheezing or shortness of breath.   urea 10 % lotion Apply 1 application topically 2 (two) times daily.       Major procedures and Radiology Reports - PLEASE review detailed and final reports for all details, in brief -      Dg Chest 2 View  Result Date: 02/18/2016 CLINICAL DATA:  Healing bed, fever, chills EXAM: CHEST  2 VIEW COMPARISON:  10/09/2011 FINDINGS: Lungs are clear.  No pleural effusion or pneumothorax. The heart is normal in size. Mild degenerative changes of  the visualized thoracolumbar spine. IMPRESSION: No evidence of acute cardiopulmonary disease. Electronically Signed   By: Charline Bills M.D.   On: 02/18/2016 15:02  Mr Abdomen Mrcp Wo Cm  Result Date: 02/20/2016 CLINICAL DATA:  Cholecystitis, preop laparoscopic cholecystectomy, evaluate for CBD stone EXAM: MRI ABDOMEN WITHOUT CONTRAST  (INCLUDING MRCP) TECHNIQUE: Multiplanar multisequence MR imaging of the abdomen was performed. Heavily T2-weighted images of the biliary and pancreatic ducts were obtained, and three-dimensional MRCP images were rendered by post processing. COMPARISON:  Right upper quadrant ultrasound dated 02/18/2016 FINDINGS: Severely motion degraded images. Lower chest:  Lung bases are clear. Hepatobiliary: 10 mm cyst inferiorly in segment 6 (series 3/ image 22). No suspicious hepatic lesions. Layering small gallstones (series 3/ image 26), without associated gallbladder wall thickening or inflammatory changes. Mild central intrahepatic ductal dilatation. Dilated common duct, measuring 10 mm (series 11/image 28). Stacked small distal CBD stones (series 5/ image 16), poorly visualized due motion degradation. Pancreas: Mild peripancreatic fluid along the pancreatic tail (series 3/image  18), nonspecific. Correlate for acute pancreatitis. No associated pancreatic ductal dilatation. Spleen: Within normal limits. Adrenals/Urinary Tract: Adrenal glands are within normal limits. Kidneys are within normal limits, noting nonspecific perinephric fluid bilaterally. No hydronephrosis. Stomach/Bowel: Stomach is within normal limits. Visualized bowel is unremarkable. Vascular/Lymphatic: No evidence of abdominal aortic aneurysm. No suspicious abdominal lymphadenopathy. Other: No abdominal ascites. Musculoskeletal: No focal osseous lesions. IMPRESSION: Severely motion degraded images. Cholelithiasis, without associated inflammatory changes to suggest acute cholecystitis. Mild intrahepatic and extrahepatic ductal dilatation. Common duct measures 10 mm. Choledocholithiasis, with small distal CBD stones. ERCP is suggested. Mild peripancreatic fluid along the pancreatic tail, nonspecific. Correlate for acute pancreatitis. Electronically Signed   By: Charline Bills M.D.   On: 02/20/2016 07:43  Dg Ercp Biliary & Pancreatic Ducts  Result Date: 02/22/2016 CLINICAL DATA:  Bile duct calculus. EXAM: ERCP . TECHNIQUE: Multiple spot images obtained with the fluoroscopic device and submitted for interpretation post-procedure. FLUOROSCOPY TIME:  Fluoroscopy Time:  6 minutes 1 second. Number of Acquired Images:  2. COMPARISON:  MRI of February 19, 2016. FINDINGS: Two fluoroscopic images were obtained during ERCP. These images demonstrate significant dilatation of the common bile duct and left intrahepatic duct. IMPRESSION: Severe dilatation of common bile duct and left intrahepatic duct. These images were submitted for radiologic interpretation only. Please see the procedural report for the amount of contrast and the fluoroscopy time utilized. Electronically Signed   By: Lupita Raider, M.D.   On: 02/22/2016 14:17  US Abdomen Limited Ruq  Result Date: 02/18/2016 CLINICAL DATA:  Patient with elevated LFTs. EXAM: US  ABDOMEN LIMITED - RIGHT UPPER QUADRANT COMPARISON:  None. FINDINGS: Gallbladder: No gallstones or wall thickening visualized. No sonographic Murphy sign noted by sonographer. Common bile duct: Diameter: 8 mm Liver: Liver is increased in echogenicity. Within the left hepatic lobe there is a 1.1 x 0.9 x 1.1 cm cyst. IMPRESSION: No cholelithiasis or sonographic evidence for acute cholecystitis. Common bile duct is prominent for age measuring up to 8 mm. In the setting of abnormal LFTs, consider further evaluation with MRCP as clinically indicated. Hepatic steatosis. Electronically Signed   By: Annia Belt M.D.   On: 02/18/2016 20:20   Micro Results     Recent Results (from the past 240 hour(s))  Urine culture     Status: Abnormal   Collection Time: 02/18/16  1:05 PM  Result Value Ref Range Status   Specimen Description URINE, RANDOM  Final   Special Requests NONE  Final   Culture MULTIPLE SPECIES PRESENT, SUGGEST RECOLLECTION (A)  Final   Report Status 02/19/2016 FINAL  Final  Culture, blood (Routine x 2)     Status: Abnormal   Collection Time: 02/18/16  1:16 PM  Result Value Ref Range Status   Specimen Description BLOOD RIGHT ANTECUBITAL  Final   Special Requests BOTTLES DRAWN AEROBIC AND ANAEROBIC  10CC  Final   Culture  Setup Time   Final    GRAM NEGATIVE RODS AEROBIC BOTTLE ONLY CRITICAL RESULT CALLED TO, READ BACK BY AND VERIFIED WITH: Artelia Laroche PHARMD AT 9604 02/19/16 BY D. VANHOOK    Culture KLEBSIELLA OXYTOCA (A)  Final   Report Status 02/21/2016 FINAL  Final   Organism ID, Bacteria KLEBSIELLA OXYTOCA  Final      Susceptibility   Klebsiella oxytoca - MIC*    AMPICILLIN 16 RESISTANT Resistant     CEFAZOLIN 16 SENSITIVE Sensitive     CEFEPIME <=1 SENSITIVE Sensitive     CEFTAZIDIME <=1 SENSITIVE Sensitive     CEFTRIAXONE <=1 SENSITIVE Sensitive     CIPROFLOXACIN <=0.25 SENSITIVE Sensitive     GENTAMICIN <=1 SENSITIVE Sensitive     IMIPENEM 0.5 SENSITIVE Sensitive      TRIMETH/SULFA <=20 SENSITIVE Sensitive     AMPICILLIN/SULBACTAM <=2 SENSITIVE Sensitive     PIP/TAZO <=4 SENSITIVE Sensitive     * KLEBSIELLA OXYTOCA  Blood Culture ID Panel (Reflexed)     Status: Abnormal   Collection Time: 02/18/16  1:16 PM  Result Value Ref Range Status   Enterococcus species NOT DETECTED NOT DETECTED Final   Vancomycin resistance NOT DETECTED NOT DETECTED Final   Listeria monocytogenes NOT DETECTED NOT DETECTED Final   Staphylococcus species NOT DETECTED NOT DETECTED Final   Staphylococcus aureus NOT DETECTED NOT DETECTED Final   Methicillin resistance NOT DETECTED NOT DETECTED Final   Streptococcus species NOT DETECTED NOT DETECTED Final   Streptococcus agalactiae NOT DETECTED NOT DETECTED Final   Streptococcus pneumoniae NOT DETECTED NOT DETECTED Final   Streptococcus pyogenes NOT DETECTED NOT DETECTED Final   Acinetobacter baumannii NOT DETECTED NOT DETECTED Final   Enterobacteriaceae species DETECTED (A) NOT DETECTED Final    Comment: CRITICAL RESULT CALLED TO, READ BACK BY AND VERIFIED WITH: Artelia Laroche PHARMD AT 5409 02/19/16 BY D. VANHOOK    Enterobacter cloacae complex NOT DETECTED NOT DETECTED Final   Escherichia coli NOT DETECTED NOT DETECTED Final   Klebsiella oxytoca DETECTED (A) NOT DETECTED Final    Comment: CRITICAL RESULT CALLED TO, READ BACK BY AND VERIFIED WITH: Artelia Laroche PHARMD AT 0925 02/19/16 BY D. VANHOOK    Klebsiella pneumoniae NOT DETECTED NOT DETECTED Final   Proteus species NOT DETECTED NOT DETECTED Final   Serratia marcescens NOT DETECTED NOT DETECTED Final   Carbapenem resistance NOT DETECTED NOT DETECTED Final   Haemophilus influenzae NOT DETECTED NOT DETECTED Final   Neisseria meningitidis NOT DETECTED NOT DETECTED Final   Pseudomonas aeruginosa NOT DETECTED NOT DETECTED Final   Candida albicans NOT DETECTED NOT DETECTED Final   Candida glabrata NOT DETECTED NOT DETECTED Final   Candida krusei NOT DETECTED NOT DETECTED Final    Candida parapsilosis NOT DETECTED NOT DETECTED Final   Candida tropicalis NOT DETECTED NOT DETECTED Final  Culture, blood (Routine x 2)     Status: None   Collection Time: 02/18/16  1:45 PM  Result Value Ref Range Status   Specimen Description BLOOD LEFT ANTECUBITAL  Final   Special Requests BOTTLES DRAWN AEROBIC AND  ANAEROBIC  5CC  Final   Culture NO GROWTH 5 DAYS  Final   Report Status 02/23/2016 FINAL  Final  MRSA PCR Screening     Status: None   Collection Time: 02/18/16  6:50 PM  Result Value Ref Range Status   MRSA by PCR NEGATIVE NEGATIVE Final    Comment:        The GeneXpert MRSA Assay (FDA approved for NASAL specimens only), is one component of a comprehensive MRSA colonization surveillance program. It is not intended to diagnose MRSA infection nor to guide or monitor treatment for MRSA infections.   C difficile quick scan w PCR reflex     Status: None   Collection Time: 02/23/16  1:12 PM  Result Value Ref Range Status   C Diff antigen NEGATIVE NEGATIVE Final   C Diff toxin NEGATIVE NEGATIVE Final   C Diff interpretation No C. difficile detected.  Final       Today   Subjective    Waneda Sankey today states diarrhea improved.  no headache,no chest no abdominal pain,no new weakness tingling or numbness, feels much better wants to go home today.    Objective   Blood pressure (!) 159/89, pulse 87, temperature 98.1 F (36.7 C), temperature source Oral, resp. rate 16, height 5\' 5"  (1.651 m), weight 60.8 kg (134 lb), SpO2 99 %.   Intake/Output Summary (Last 24 hours) at 02/24/16 0850 Last data filed at 02/24/16 0600  Gross per 24 hour  Intake           3217.5 ml  Output              701 ml  Net           2516.5 ml    Exam Awake Alert, Oriented x 3, No new F.N deficits, Normal affect [place].AT,PERRAL Supple Neck,No JVD, No cervical lymphadenopathy appriciated.  Symmetrical Chest wall movement, Good air movement bilaterally, CTAB RRR,No Gallops,Rubs or new  Murmurs, No Parasternal Heave +ve B.Sounds, Abd Soft, Non tender, No organomegaly appriciated, No rebound -guarding or rigidity. No Cyanosis, Clubbing or edema, No new Rash or bruise   Data Review   CBC w Diff: Lab Results  Component Value Date   WBC 8.1 02/24/2016   HGB 9.2 (L) 02/24/2016   HCT 29.8 (L) 02/24/2016   PLT 107 (L) 02/24/2016   LYMPHOPCT 23 02/23/2016   MONOPCT 6 02/23/2016   EOSPCT 2 02/23/2016   BASOPCT 0 02/23/2016    CMP: Lab Results  Component Value Date   NA 138 02/24/2016   K 3.5 02/24/2016   CL 108 02/24/2016   CO2 23 02/24/2016   BUN 10 02/24/2016   CREATININE 0.72 02/24/2016   PROT 6.2 (L) 02/24/2016   ALBUMIN 2.4 (L) 02/24/2016   BILITOT 0.3 02/24/2016   ALKPHOS 85 02/24/2016   AST 40 02/24/2016   ALT 57 (H) 02/24/2016  .   Total Time in preparing paper work, data evaluation and todays exam - 35 minutes  Pearson Grippe M.D on 02/24/2016 at 8:50 AM  [facility]   Office  [phone]

## 2016-02-26 DIAGNOSIS — E43 Unspecified severe protein-calorie malnutrition: Secondary | ICD-10-CM | POA: Diagnosis not present

## 2016-02-26 DIAGNOSIS — Z720 Tobacco use: Secondary | ICD-10-CM | POA: Diagnosis not present

## 2016-02-26 DIAGNOSIS — I10 Essential (primary) hypertension: Secondary | ICD-10-CM | POA: Diagnosis not present

## 2016-02-26 DIAGNOSIS — R531 Weakness: Secondary | ICD-10-CM | POA: Diagnosis not present

## 2016-02-26 DIAGNOSIS — E039 Hypothyroidism, unspecified: Secondary | ICD-10-CM | POA: Diagnosis not present

## 2016-02-26 DIAGNOSIS — R269 Unspecified abnormalities of gait and mobility: Secondary | ICD-10-CM | POA: Diagnosis not present

## 2016-02-27 DIAGNOSIS — R531 Weakness: Secondary | ICD-10-CM | POA: Diagnosis not present

## 2016-02-27 DIAGNOSIS — Z720 Tobacco use: Secondary | ICD-10-CM | POA: Diagnosis not present

## 2016-02-27 DIAGNOSIS — E039 Hypothyroidism, unspecified: Secondary | ICD-10-CM | POA: Diagnosis not present

## 2016-02-27 DIAGNOSIS — E43 Unspecified severe protein-calorie malnutrition: Secondary | ICD-10-CM | POA: Diagnosis not present

## 2016-02-27 DIAGNOSIS — R269 Unspecified abnormalities of gait and mobility: Secondary | ICD-10-CM | POA: Diagnosis not present

## 2016-02-27 DIAGNOSIS — I10 Essential (primary) hypertension: Secondary | ICD-10-CM | POA: Diagnosis not present

## 2016-02-28 LAB — FECAL LACTOFERRIN, QUANT: Lactoferrin, Fecal, Quant.: <1 ug/mL(g) (ref 0.00–7.24)

## 2016-02-28 LAB — STOOL CULTURE: E COLI SHIGA TOXIN ASSAY: NEGATIVE

## 2016-02-28 LAB — STOOL CULTURE REFLEX - CMPCXR

## 2016-02-28 LAB — STOOL CULTURE REFLEX - RSASHR

## 2016-02-29 DIAGNOSIS — Z720 Tobacco use: Secondary | ICD-10-CM | POA: Diagnosis not present

## 2016-02-29 DIAGNOSIS — R531 Weakness: Secondary | ICD-10-CM | POA: Diagnosis not present

## 2016-02-29 DIAGNOSIS — I10 Essential (primary) hypertension: Secondary | ICD-10-CM | POA: Diagnosis not present

## 2016-02-29 DIAGNOSIS — E43 Unspecified severe protein-calorie malnutrition: Secondary | ICD-10-CM | POA: Diagnosis not present

## 2016-02-29 DIAGNOSIS — R269 Unspecified abnormalities of gait and mobility: Secondary | ICD-10-CM | POA: Diagnosis not present

## 2016-02-29 DIAGNOSIS — E039 Hypothyroidism, unspecified: Secondary | ICD-10-CM | POA: Diagnosis not present

## 2016-03-01 DIAGNOSIS — E039 Hypothyroidism, unspecified: Secondary | ICD-10-CM | POA: Diagnosis not present

## 2016-03-01 DIAGNOSIS — R269 Unspecified abnormalities of gait and mobility: Secondary | ICD-10-CM | POA: Diagnosis not present

## 2016-03-01 DIAGNOSIS — Z720 Tobacco use: Secondary | ICD-10-CM | POA: Diagnosis not present

## 2016-03-01 DIAGNOSIS — R531 Weakness: Secondary | ICD-10-CM | POA: Diagnosis not present

## 2016-03-01 DIAGNOSIS — E43 Unspecified severe protein-calorie malnutrition: Secondary | ICD-10-CM | POA: Diagnosis not present

## 2016-03-01 DIAGNOSIS — I10 Essential (primary) hypertension: Secondary | ICD-10-CM | POA: Diagnosis not present

## 2016-03-05 DIAGNOSIS — I10 Essential (primary) hypertension: Secondary | ICD-10-CM | POA: Diagnosis not present

## 2016-03-05 DIAGNOSIS — R269 Unspecified abnormalities of gait and mobility: Secondary | ICD-10-CM | POA: Diagnosis not present

## 2016-03-05 DIAGNOSIS — E43 Unspecified severe protein-calorie malnutrition: Secondary | ICD-10-CM | POA: Diagnosis not present

## 2016-03-05 DIAGNOSIS — E039 Hypothyroidism, unspecified: Secondary | ICD-10-CM | POA: Diagnosis not present

## 2016-03-05 DIAGNOSIS — R531 Weakness: Secondary | ICD-10-CM | POA: Diagnosis not present

## 2016-03-05 DIAGNOSIS — Z720 Tobacco use: Secondary | ICD-10-CM | POA: Diagnosis not present

## 2016-03-07 DIAGNOSIS — R531 Weakness: Secondary | ICD-10-CM | POA: Diagnosis not present

## 2016-03-07 DIAGNOSIS — E43 Unspecified severe protein-calorie malnutrition: Secondary | ICD-10-CM | POA: Diagnosis not present

## 2016-03-07 DIAGNOSIS — E039 Hypothyroidism, unspecified: Secondary | ICD-10-CM | POA: Diagnosis not present

## 2016-03-07 DIAGNOSIS — R771 Abnormality of globulin: Secondary | ICD-10-CM | POA: Diagnosis not present

## 2016-03-07 DIAGNOSIS — E785 Hyperlipidemia, unspecified: Secondary | ICD-10-CM | POA: Diagnosis not present

## 2016-03-07 DIAGNOSIS — R269 Unspecified abnormalities of gait and mobility: Secondary | ICD-10-CM | POA: Diagnosis not present

## 2016-03-07 DIAGNOSIS — Z720 Tobacco use: Secondary | ICD-10-CM | POA: Diagnosis not present

## 2016-03-07 DIAGNOSIS — I10 Essential (primary) hypertension: Secondary | ICD-10-CM | POA: Diagnosis not present

## 2016-03-08 DIAGNOSIS — I10 Essential (primary) hypertension: Secondary | ICD-10-CM | POA: Diagnosis not present

## 2016-03-08 DIAGNOSIS — E43 Unspecified severe protein-calorie malnutrition: Secondary | ICD-10-CM | POA: Diagnosis not present

## 2016-03-08 DIAGNOSIS — Z720 Tobacco use: Secondary | ICD-10-CM | POA: Diagnosis not present

## 2016-03-08 DIAGNOSIS — R531 Weakness: Secondary | ICD-10-CM | POA: Diagnosis not present

## 2016-03-08 DIAGNOSIS — R269 Unspecified abnormalities of gait and mobility: Secondary | ICD-10-CM | POA: Diagnosis not present

## 2016-03-08 DIAGNOSIS — E039 Hypothyroidism, unspecified: Secondary | ICD-10-CM | POA: Diagnosis not present

## 2016-03-11 DIAGNOSIS — E43 Unspecified severe protein-calorie malnutrition: Secondary | ICD-10-CM | POA: Diagnosis not present

## 2016-03-11 DIAGNOSIS — R531 Weakness: Secondary | ICD-10-CM | POA: Diagnosis not present

## 2016-03-11 DIAGNOSIS — R269 Unspecified abnormalities of gait and mobility: Secondary | ICD-10-CM | POA: Diagnosis not present

## 2016-03-11 DIAGNOSIS — E039 Hypothyroidism, unspecified: Secondary | ICD-10-CM | POA: Diagnosis not present

## 2016-03-11 DIAGNOSIS — Z720 Tobacco use: Secondary | ICD-10-CM | POA: Diagnosis not present

## 2016-03-11 DIAGNOSIS — I10 Essential (primary) hypertension: Secondary | ICD-10-CM | POA: Diagnosis not present

## 2016-03-12 DIAGNOSIS — E039 Hypothyroidism, unspecified: Secondary | ICD-10-CM | POA: Diagnosis not present

## 2016-03-12 DIAGNOSIS — I10 Essential (primary) hypertension: Secondary | ICD-10-CM | POA: Diagnosis not present

## 2016-03-12 DIAGNOSIS — E43 Unspecified severe protein-calorie malnutrition: Secondary | ICD-10-CM | POA: Diagnosis not present

## 2016-03-12 DIAGNOSIS — R269 Unspecified abnormalities of gait and mobility: Secondary | ICD-10-CM | POA: Diagnosis not present

## 2016-03-12 DIAGNOSIS — R531 Weakness: Secondary | ICD-10-CM | POA: Diagnosis not present

## 2016-03-12 DIAGNOSIS — Z720 Tobacco use: Secondary | ICD-10-CM | POA: Diagnosis not present

## 2016-03-13 ENCOUNTER — Telehealth: Payer: Self-pay | Admitting: *Deleted

## 2016-03-13 DIAGNOSIS — R269 Unspecified abnormalities of gait and mobility: Secondary | ICD-10-CM | POA: Diagnosis not present

## 2016-03-13 DIAGNOSIS — I10 Essential (primary) hypertension: Secondary | ICD-10-CM | POA: Diagnosis not present

## 2016-03-13 DIAGNOSIS — Z720 Tobacco use: Secondary | ICD-10-CM | POA: Diagnosis not present

## 2016-03-13 DIAGNOSIS — E43 Unspecified severe protein-calorie malnutrition: Secondary | ICD-10-CM | POA: Diagnosis not present

## 2016-03-13 DIAGNOSIS — E039 Hypothyroidism, unspecified: Secondary | ICD-10-CM | POA: Diagnosis not present

## 2016-03-13 DIAGNOSIS — R531 Weakness: Secondary | ICD-10-CM | POA: Diagnosis not present

## 2016-03-13 NOTE — Telephone Encounter (Signed)
TC to referring office. Received fax with notes and lab work dated 12/28/2014-012017 Advised referral coordinator we need reason for referral, updated records since pt has been seen in the office since 07/2015.   Faxed new patient referral form to [facility] and advised Taylor Bass this fax was coming.   No referral to enter now. She does not see record of reason for referral to our office.

## 2016-03-14 DIAGNOSIS — R531 Weakness: Secondary | ICD-10-CM | POA: Diagnosis not present

## 2016-03-14 DIAGNOSIS — Z720 Tobacco use: Secondary | ICD-10-CM | POA: Diagnosis not present

## 2016-03-14 DIAGNOSIS — E43 Unspecified severe protein-calorie malnutrition: Secondary | ICD-10-CM | POA: Diagnosis not present

## 2016-03-14 DIAGNOSIS — E039 Hypothyroidism, unspecified: Secondary | ICD-10-CM | POA: Diagnosis not present

## 2016-03-14 DIAGNOSIS — R269 Unspecified abnormalities of gait and mobility: Secondary | ICD-10-CM | POA: Diagnosis not present

## 2016-03-14 DIAGNOSIS — I10 Essential (primary) hypertension: Secondary | ICD-10-CM | POA: Diagnosis not present

## 2016-03-18 DIAGNOSIS — E43 Unspecified severe protein-calorie malnutrition: Secondary | ICD-10-CM | POA: Diagnosis not present

## 2016-03-18 DIAGNOSIS — Z720 Tobacco use: Secondary | ICD-10-CM | POA: Diagnosis not present

## 2016-03-18 DIAGNOSIS — I10 Essential (primary) hypertension: Secondary | ICD-10-CM | POA: Diagnosis not present

## 2016-03-18 DIAGNOSIS — R531 Weakness: Secondary | ICD-10-CM | POA: Diagnosis not present

## 2016-03-18 DIAGNOSIS — E039 Hypothyroidism, unspecified: Secondary | ICD-10-CM | POA: Diagnosis not present

## 2016-03-18 DIAGNOSIS — R269 Unspecified abnormalities of gait and mobility: Secondary | ICD-10-CM | POA: Diagnosis not present

## 2016-03-19 DIAGNOSIS — Z720 Tobacco use: Secondary | ICD-10-CM | POA: Diagnosis not present

## 2016-03-19 DIAGNOSIS — I10 Essential (primary) hypertension: Secondary | ICD-10-CM | POA: Diagnosis not present

## 2016-03-19 DIAGNOSIS — R771 Abnormality of globulin: Secondary | ICD-10-CM | POA: Diagnosis not present

## 2016-03-19 DIAGNOSIS — E039 Hypothyroidism, unspecified: Secondary | ICD-10-CM | POA: Diagnosis not present

## 2016-03-19 DIAGNOSIS — J45909 Unspecified asthma, uncomplicated: Secondary | ICD-10-CM | POA: Diagnosis not present

## 2016-03-19 DIAGNOSIS — E785 Hyperlipidemia, unspecified: Secondary | ICD-10-CM | POA: Diagnosis not present

## 2016-03-20 DIAGNOSIS — R531 Weakness: Secondary | ICD-10-CM | POA: Diagnosis not present

## 2016-03-20 DIAGNOSIS — E039 Hypothyroidism, unspecified: Secondary | ICD-10-CM | POA: Diagnosis not present

## 2016-03-20 DIAGNOSIS — I10 Essential (primary) hypertension: Secondary | ICD-10-CM | POA: Diagnosis not present

## 2016-03-20 DIAGNOSIS — E43 Unspecified severe protein-calorie malnutrition: Secondary | ICD-10-CM | POA: Diagnosis not present

## 2016-03-20 DIAGNOSIS — R269 Unspecified abnormalities of gait and mobility: Secondary | ICD-10-CM | POA: Diagnosis not present

## 2016-03-20 DIAGNOSIS — Z720 Tobacco use: Secondary | ICD-10-CM | POA: Diagnosis not present

## 2016-03-22 DIAGNOSIS — I10 Essential (primary) hypertension: Secondary | ICD-10-CM | POA: Diagnosis not present

## 2016-03-22 DIAGNOSIS — R269 Unspecified abnormalities of gait and mobility: Secondary | ICD-10-CM | POA: Diagnosis not present

## 2016-03-22 DIAGNOSIS — E43 Unspecified severe protein-calorie malnutrition: Secondary | ICD-10-CM | POA: Diagnosis not present

## 2016-03-22 DIAGNOSIS — R531 Weakness: Secondary | ICD-10-CM | POA: Diagnosis not present

## 2016-03-22 DIAGNOSIS — Z720 Tobacco use: Secondary | ICD-10-CM | POA: Diagnosis not present

## 2016-03-22 DIAGNOSIS — E039 Hypothyroidism, unspecified: Secondary | ICD-10-CM | POA: Diagnosis not present

## 2016-03-25 DIAGNOSIS — R531 Weakness: Secondary | ICD-10-CM | POA: Diagnosis not present

## 2016-03-25 DIAGNOSIS — E43 Unspecified severe protein-calorie malnutrition: Secondary | ICD-10-CM | POA: Diagnosis not present

## 2016-03-25 DIAGNOSIS — I10 Essential (primary) hypertension: Secondary | ICD-10-CM | POA: Diagnosis not present

## 2016-03-25 DIAGNOSIS — E039 Hypothyroidism, unspecified: Secondary | ICD-10-CM | POA: Diagnosis not present

## 2016-03-25 DIAGNOSIS — R269 Unspecified abnormalities of gait and mobility: Secondary | ICD-10-CM | POA: Diagnosis not present

## 2016-03-25 DIAGNOSIS — Z720 Tobacco use: Secondary | ICD-10-CM | POA: Diagnosis not present

## 2016-03-29 DIAGNOSIS — I10 Essential (primary) hypertension: Secondary | ICD-10-CM | POA: Diagnosis not present

## 2016-03-29 DIAGNOSIS — R269 Unspecified abnormalities of gait and mobility: Secondary | ICD-10-CM | POA: Diagnosis not present

## 2016-03-29 DIAGNOSIS — E039 Hypothyroidism, unspecified: Secondary | ICD-10-CM | POA: Diagnosis not present

## 2016-03-29 DIAGNOSIS — Z720 Tobacco use: Secondary | ICD-10-CM | POA: Diagnosis not present

## 2016-03-29 DIAGNOSIS — E43 Unspecified severe protein-calorie malnutrition: Secondary | ICD-10-CM | POA: Diagnosis not present

## 2016-03-29 DIAGNOSIS — R531 Weakness: Secondary | ICD-10-CM | POA: Diagnosis not present

## 2016-04-03 DIAGNOSIS — Z720 Tobacco use: Secondary | ICD-10-CM | POA: Diagnosis not present

## 2016-04-03 DIAGNOSIS — R269 Unspecified abnormalities of gait and mobility: Secondary | ICD-10-CM | POA: Diagnosis not present

## 2016-04-03 DIAGNOSIS — I10 Essential (primary) hypertension: Secondary | ICD-10-CM | POA: Diagnosis not present

## 2016-04-03 DIAGNOSIS — E039 Hypothyroidism, unspecified: Secondary | ICD-10-CM | POA: Diagnosis not present

## 2016-04-03 DIAGNOSIS — E43 Unspecified severe protein-calorie malnutrition: Secondary | ICD-10-CM | POA: Diagnosis not present

## 2016-04-03 DIAGNOSIS — R531 Weakness: Secondary | ICD-10-CM | POA: Diagnosis not present

## 2016-04-04 DIAGNOSIS — R531 Weakness: Secondary | ICD-10-CM | POA: Diagnosis not present

## 2016-04-04 DIAGNOSIS — E039 Hypothyroidism, unspecified: Secondary | ICD-10-CM | POA: Diagnosis not present

## 2016-04-04 DIAGNOSIS — R269 Unspecified abnormalities of gait and mobility: Secondary | ICD-10-CM | POA: Diagnosis not present

## 2016-04-04 DIAGNOSIS — I10 Essential (primary) hypertension: Secondary | ICD-10-CM | POA: Diagnosis not present

## 2016-04-04 DIAGNOSIS — E43 Unspecified severe protein-calorie malnutrition: Secondary | ICD-10-CM | POA: Diagnosis not present

## 2016-04-04 DIAGNOSIS — Z720 Tobacco use: Secondary | ICD-10-CM | POA: Diagnosis not present

## 2016-04-05 DIAGNOSIS — E43 Unspecified severe protein-calorie malnutrition: Secondary | ICD-10-CM | POA: Diagnosis not present

## 2016-04-05 DIAGNOSIS — R531 Weakness: Secondary | ICD-10-CM | POA: Diagnosis not present

## 2016-04-05 DIAGNOSIS — R269 Unspecified abnormalities of gait and mobility: Secondary | ICD-10-CM | POA: Diagnosis not present

## 2016-04-05 DIAGNOSIS — E039 Hypothyroidism, unspecified: Secondary | ICD-10-CM | POA: Diagnosis not present

## 2016-04-05 DIAGNOSIS — I10 Essential (primary) hypertension: Secondary | ICD-10-CM | POA: Diagnosis not present

## 2016-04-05 DIAGNOSIS — Z720 Tobacco use: Secondary | ICD-10-CM | POA: Diagnosis not present

## 2016-04-09 DIAGNOSIS — Z720 Tobacco use: Secondary | ICD-10-CM | POA: Diagnosis not present

## 2016-04-09 DIAGNOSIS — R269 Unspecified abnormalities of gait and mobility: Secondary | ICD-10-CM | POA: Diagnosis not present

## 2016-04-09 DIAGNOSIS — E43 Unspecified severe protein-calorie malnutrition: Secondary | ICD-10-CM | POA: Diagnosis not present

## 2016-04-09 DIAGNOSIS — E039 Hypothyroidism, unspecified: Secondary | ICD-10-CM | POA: Diagnosis not present

## 2016-04-09 DIAGNOSIS — R531 Weakness: Secondary | ICD-10-CM | POA: Diagnosis not present

## 2016-04-09 DIAGNOSIS — I10 Essential (primary) hypertension: Secondary | ICD-10-CM | POA: Diagnosis not present

## 2016-04-11 DIAGNOSIS — E785 Hyperlipidemia, unspecified: Secondary | ICD-10-CM | POA: Diagnosis not present

## 2016-04-11 DIAGNOSIS — E039 Hypothyroidism, unspecified: Secondary | ICD-10-CM | POA: Diagnosis not present

## 2016-04-11 DIAGNOSIS — J45909 Unspecified asthma, uncomplicated: Secondary | ICD-10-CM | POA: Diagnosis not present

## 2016-04-11 DIAGNOSIS — I10 Essential (primary) hypertension: Secondary | ICD-10-CM | POA: Diagnosis not present

## 2016-04-11 DIAGNOSIS — Z720 Tobacco use: Secondary | ICD-10-CM | POA: Diagnosis not present

## 2016-04-17 DIAGNOSIS — R531 Weakness: Secondary | ICD-10-CM | POA: Diagnosis not present

## 2016-04-17 DIAGNOSIS — E039 Hypothyroidism, unspecified: Secondary | ICD-10-CM | POA: Diagnosis not present

## 2016-04-17 DIAGNOSIS — R269 Unspecified abnormalities of gait and mobility: Secondary | ICD-10-CM | POA: Diagnosis not present

## 2016-04-17 DIAGNOSIS — I10 Essential (primary) hypertension: Secondary | ICD-10-CM | POA: Diagnosis not present

## 2016-04-17 DIAGNOSIS — E43 Unspecified severe protein-calorie malnutrition: Secondary | ICD-10-CM | POA: Diagnosis not present

## 2016-04-17 DIAGNOSIS — Z720 Tobacco use: Secondary | ICD-10-CM | POA: Diagnosis not present

## 2016-05-16 DIAGNOSIS — Z720 Tobacco use: Secondary | ICD-10-CM | POA: Diagnosis not present

## 2016-05-16 DIAGNOSIS — J45909 Unspecified asthma, uncomplicated: Secondary | ICD-10-CM | POA: Diagnosis not present

## 2016-05-16 DIAGNOSIS — E039 Hypothyroidism, unspecified: Secondary | ICD-10-CM | POA: Diagnosis not present

## 2016-05-16 DIAGNOSIS — I10 Essential (primary) hypertension: Secondary | ICD-10-CM | POA: Diagnosis not present

## 2016-05-16 DIAGNOSIS — E785 Hyperlipidemia, unspecified: Secondary | ICD-10-CM | POA: Diagnosis not present

## 2016-12-11 DIAGNOSIS — E785 Hyperlipidemia, unspecified: Secondary | ICD-10-CM | POA: Diagnosis not present

## 2016-12-11 DIAGNOSIS — E039 Hypothyroidism, unspecified: Secondary | ICD-10-CM | POA: Diagnosis not present

## 2016-12-11 DIAGNOSIS — J45909 Unspecified asthma, uncomplicated: Secondary | ICD-10-CM | POA: Diagnosis not present

## 2016-12-11 DIAGNOSIS — Z0000 Encounter for general adult medical examination without abnormal findings: Secondary | ICD-10-CM | POA: Diagnosis not present

## 2016-12-11 DIAGNOSIS — I10 Essential (primary) hypertension: Secondary | ICD-10-CM | POA: Diagnosis not present

## 2016-12-11 DIAGNOSIS — E559 Vitamin D deficiency, unspecified: Secondary | ICD-10-CM | POA: Diagnosis not present

## 2017-01-22 DIAGNOSIS — E785 Hyperlipidemia, unspecified: Secondary | ICD-10-CM | POA: Diagnosis not present

## 2017-01-22 DIAGNOSIS — I10 Essential (primary) hypertension: Secondary | ICD-10-CM | POA: Diagnosis not present

## 2017-01-22 DIAGNOSIS — E039 Hypothyroidism, unspecified: Secondary | ICD-10-CM | POA: Diagnosis not present

## 2017-01-22 DIAGNOSIS — Z720 Tobacco use: Secondary | ICD-10-CM | POA: Diagnosis not present

## 2017-01-22 DIAGNOSIS — J45909 Unspecified asthma, uncomplicated: Secondary | ICD-10-CM | POA: Diagnosis not present

## 2017-05-28 DIAGNOSIS — Z720 Tobacco use: Secondary | ICD-10-CM | POA: Diagnosis not present

## 2017-05-28 DIAGNOSIS — E785 Hyperlipidemia, unspecified: Secondary | ICD-10-CM | POA: Diagnosis not present

## 2017-05-28 DIAGNOSIS — J45909 Unspecified asthma, uncomplicated: Secondary | ICD-10-CM | POA: Diagnosis not present

## 2017-05-28 DIAGNOSIS — E039 Hypothyroidism, unspecified: Secondary | ICD-10-CM | POA: Diagnosis not present

## 2017-05-28 DIAGNOSIS — I10 Essential (primary) hypertension: Secondary | ICD-10-CM | POA: Diagnosis not present

## 2017-06-11 IMAGING — MR MR MRCP
5 of 10 series · 18 of 48 positions shown · non-contrast
Comparison: Right upper quadrant ultrasound dated 02/18/2016

CLINICAL DATA: Cholecystitis, preop laparoscopic cholecystectomy,
evaluate for CBD stone

EXAM:
MRI ABDOMEN WITHOUT CONTRAST  (INCLUDING MRCP)
TECHNIQUE: Multiplanar multisequence MR imaging of the abdomen was performed.
Heavily T2-weighted images of the biliary and pancreatic ducts were
obtained, and three-dimensional MRCP images were rendered by post
processing.

[Series 3: T2 · axial · 5.0mm · 0.66mm/px · z∈[-36,+164]mm · 6 of 41 slices shown (1 of 3)]
[im 1/41]
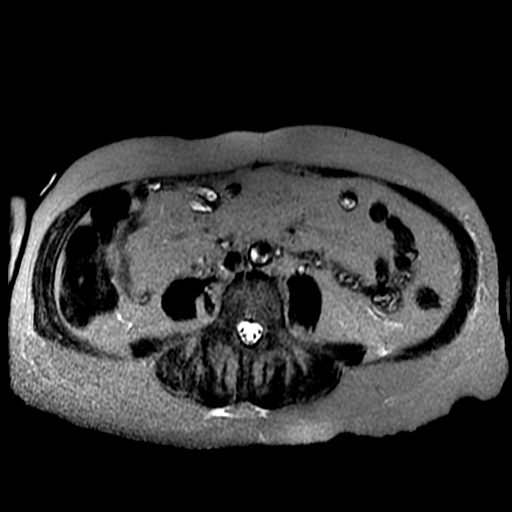
[im 9/41]
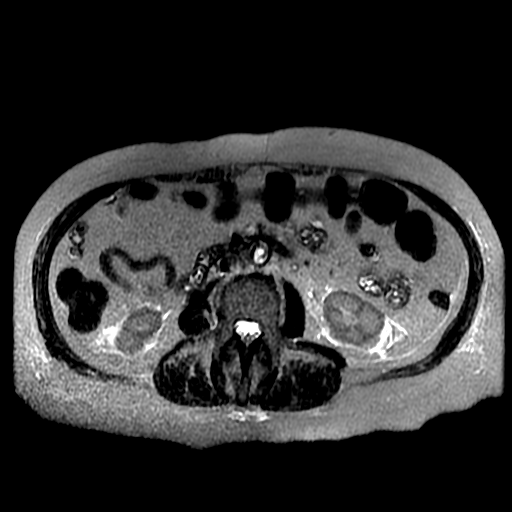
[im 17/41]
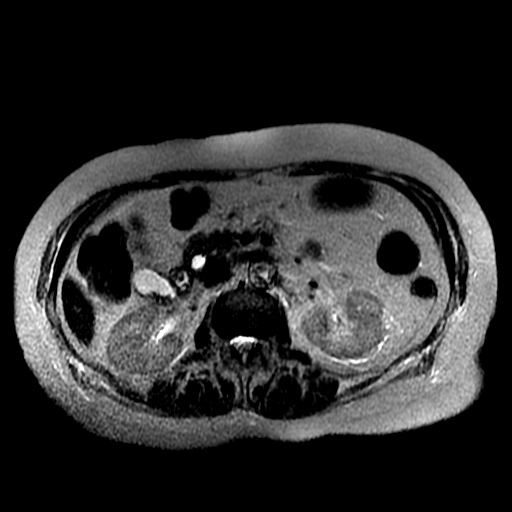
[im 25/41]
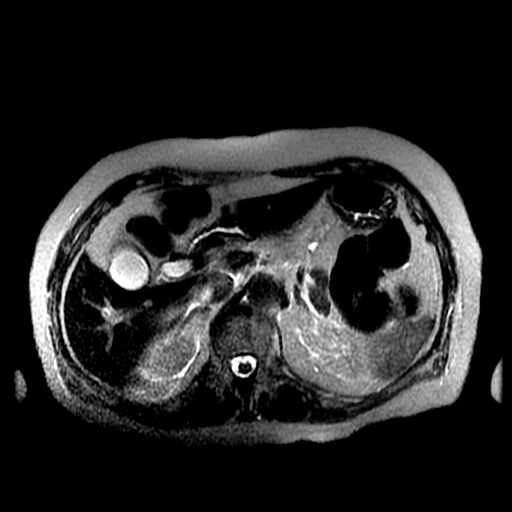
[im 33/41]
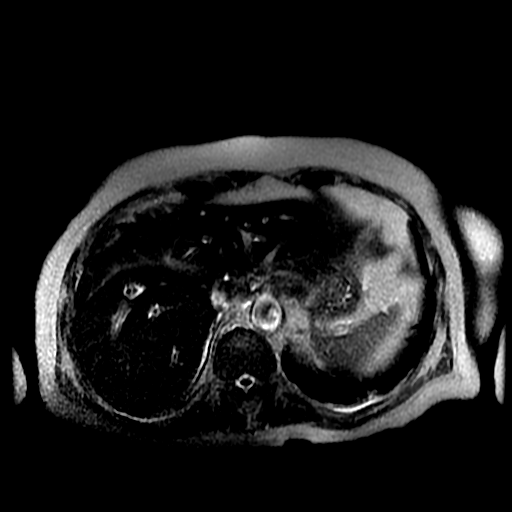
[im 41/41]
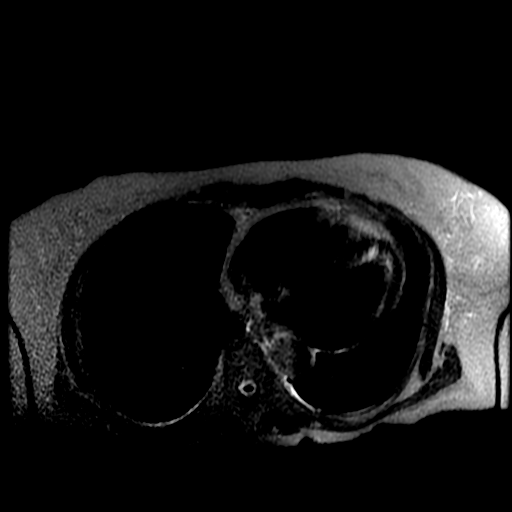

[Series 5: T2 · coronal · 5.0mm · 0.82mm/px · 4 of 29 slices shown (2 of 3)]
[im 1/29]
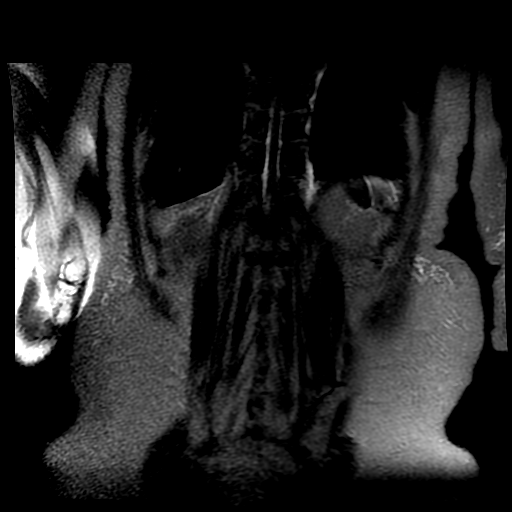
[im 10/29]
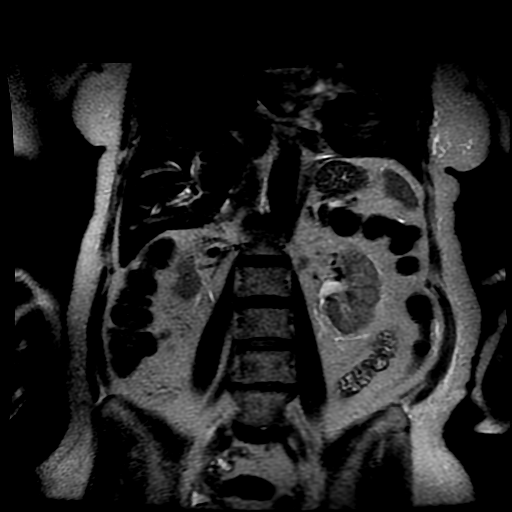
[im 19/29]
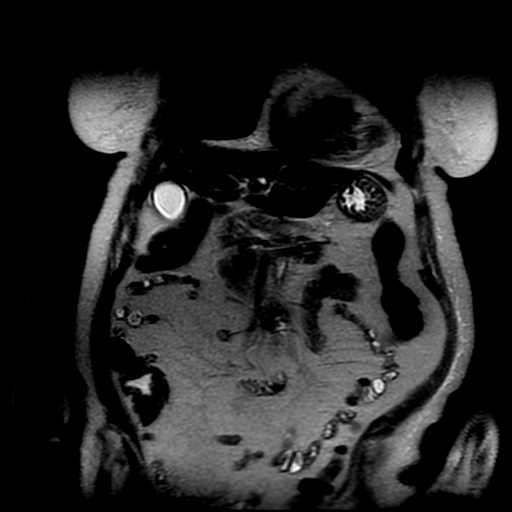
[im 29/29]
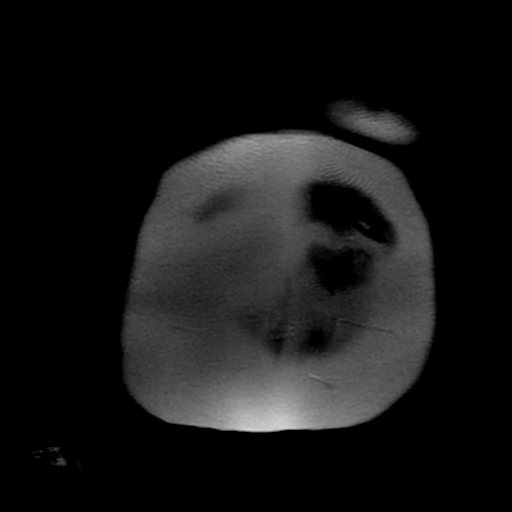

[Series 8: T2 · axial · 5.0mm · 0.62mm/px · z∈[+61,+146]mm · 3 of 18 slices shown (3 of 3)]
[im 1/18]
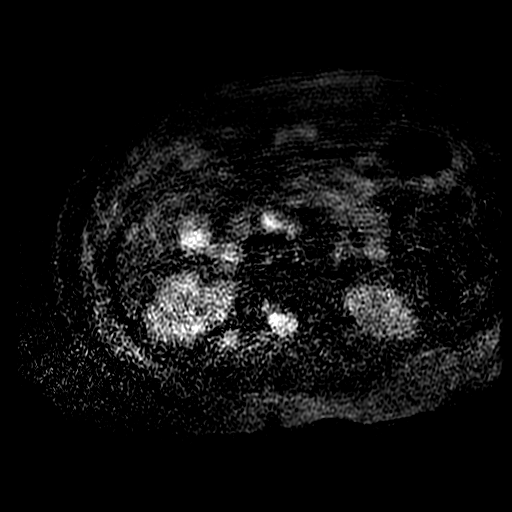
[im 9/18]
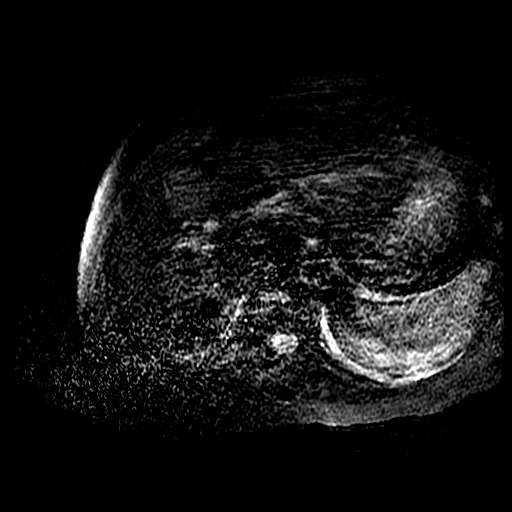
[im 18/18]
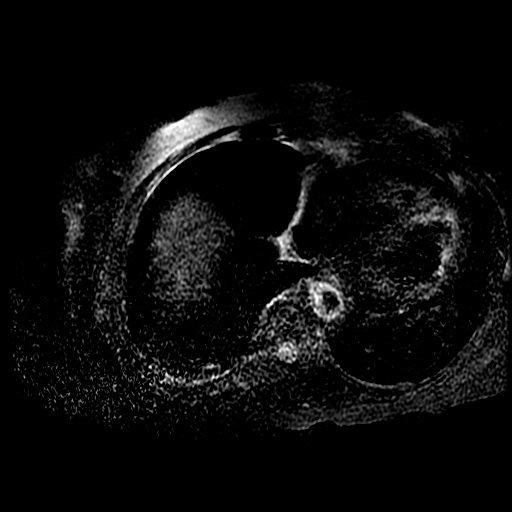

[Series 9: bSSFP · coronal · 4.0mm · 0.78mm/px · 3 of 20 slices shown (1 of 2)]
[im 1/20]
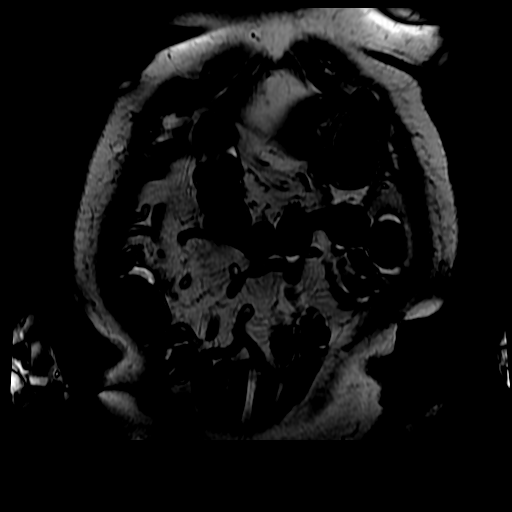
[im 10/20]
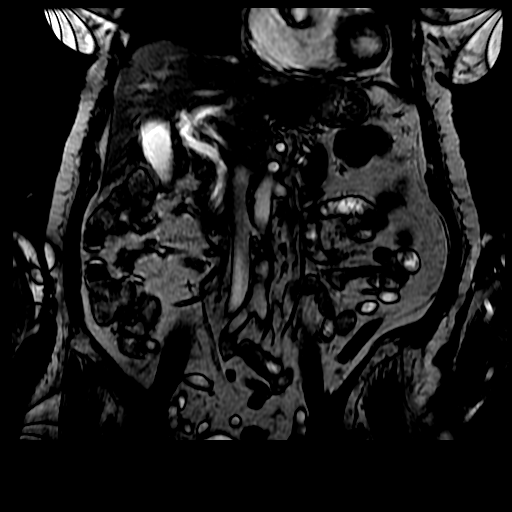
[im 20/20]
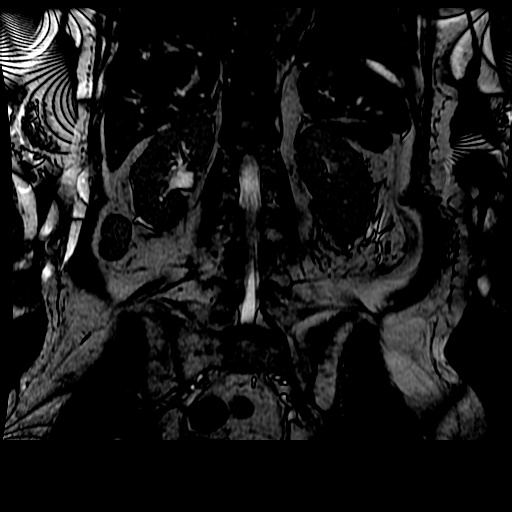

[Series 10: bSSFP · axial · 4.0mm · 0.66mm/px · z∈[-48,-18]mm · 2 of 34 slices shown (2 of 2)]
[im 1/34]
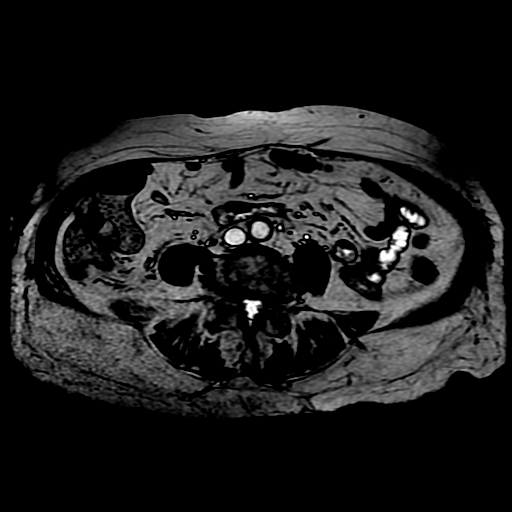
[im 7/34]
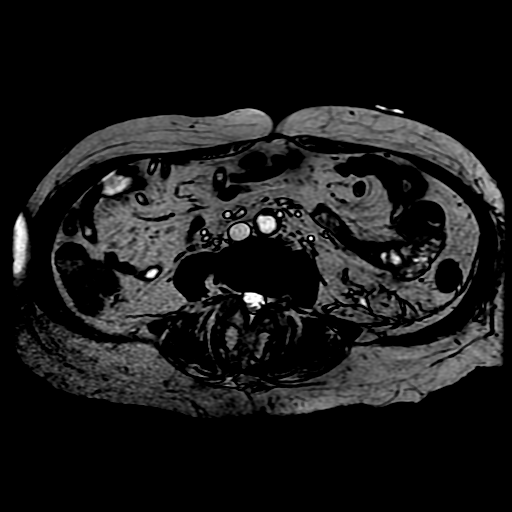

[18 of 48 positions shown; findings below may reference images not displayed]

FINDINGS: Severely motion degraded images.

Lower chest:  Lung bases are clear.

Hepatobiliary: 10 mm cyst inferiorly in segment 6 (series 3/ image
22). No suspicious hepatic lesions.

Layering small gallstones (series 3/ image 26), without associated
gallbladder wall thickening or inflammatory changes.

Mild central intrahepatic ductal dilatation. Dilated common duct,
measuring 10 mm (series 11/image 28). Stacked small distal CBD
stones (series 5/ image 16), poorly visualized due motion
degradation.

Pancreas: Mild peripancreatic fluid along the pancreatic tail
(series 3/image 18), nonspecific. Correlate for acute pancreatitis.
No associated pancreatic ductal dilatation.

Spleen: Within normal limits.

Adrenals/Urinary Tract: Adrenal glands are within normal limits.

Kidneys are within normal limits, noting nonspecific perinephric
fluid bilaterally. No hydronephrosis.

Stomach/Bowel: Stomach is within normal limits.

Visualized bowel is unremarkable.

Vascular/Lymphatic: No evidence of abdominal aortic aneurysm.

No suspicious abdominal lymphadenopathy.

Other: No abdominal ascites.

Musculoskeletal: No focal osseous lesions.
IMPRESSION: Severely motion degraded images.

Cholelithiasis, without associated inflammatory changes to suggest
acute cholecystitis.

Mild intrahepatic and extrahepatic ductal dilatation. Common duct
measures 10 mm. Choledocholithiasis, with small distal CBD stones.
ERCP is suggested.

Mild peripancreatic fluid along the pancreatic tail, nonspecific.
Correlate for acute pancreatitis.

## 2017-06-14 IMAGING — RF DG ERCP WO/W SPHINCTEROTOMY
1 series · 2 of 2 positions shown · non-contrast
Comparison: MRI February 19, 2016.

CLINICAL DATA: Bile duct calculus.

EXAM:
ERCP .
TECHNIQUE: Multiple spot images obtained with the fluoroscopic device and
submitted for interpretation post-procedure.
FLUOROSCOPY TIME:  Fluoroscopy Time:  6 minutes 1 second.
Number of Acquired Images:  2.

[Series 1: run · 2 of 2 slices shown]
[im 1/2]
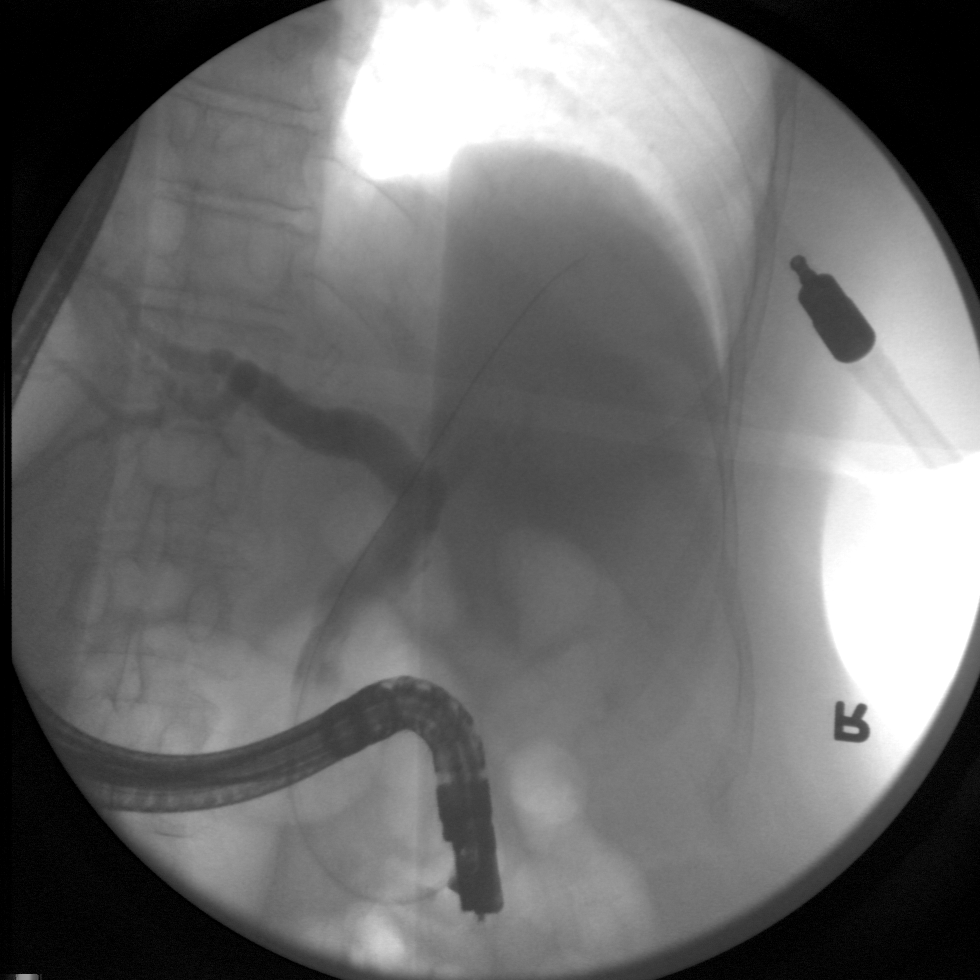
[im 2/2]
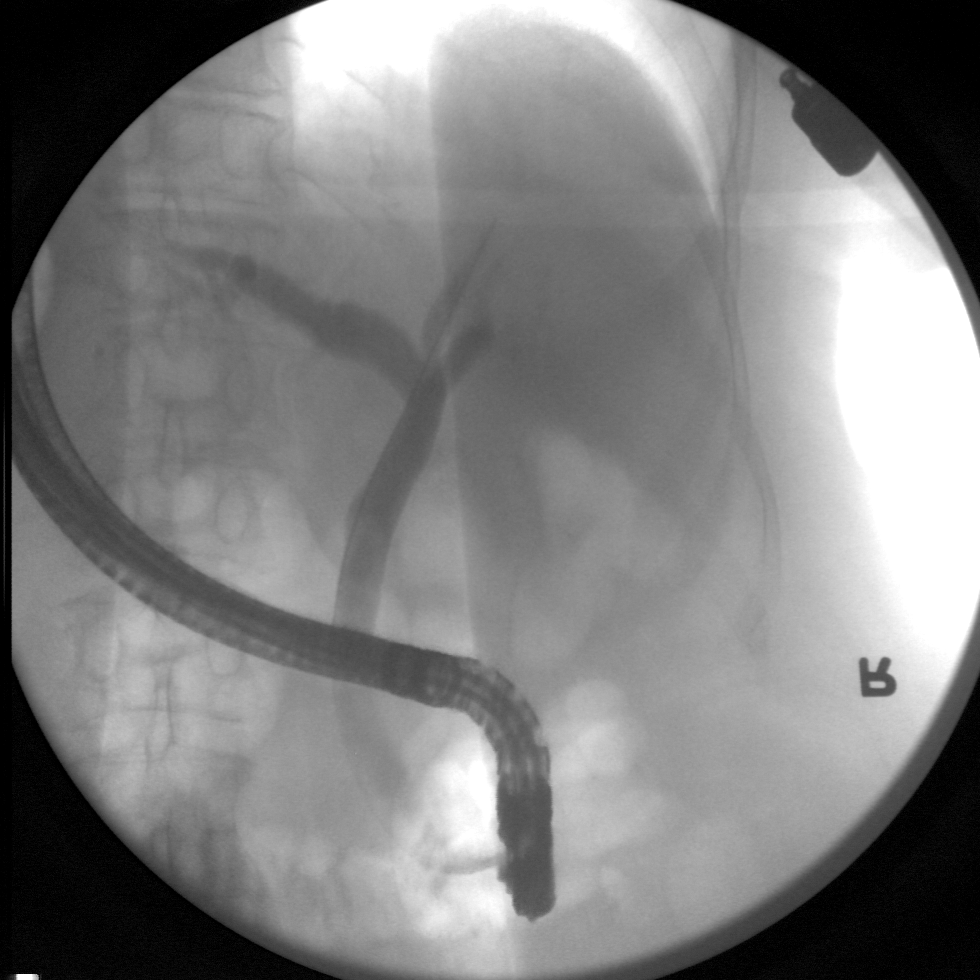

[2 of 2 positions shown; findings below may reference images not displayed]

FINDINGS: Two fluoroscopic images were obtained during ERCP. These images
demonstrate significant dilatation of the common bile duct and left
intrahepatic duct.
IMPRESSION: Severe dilatation of common bile duct and left intrahepatic duct.

These images were submitted for radiologic interpretation only.
Please see the procedural report for the amount of contrast and the
fluoroscopy time utilized.

## 2018-06-16 DIAGNOSIS — Z720 Tobacco use: Secondary | ICD-10-CM | POA: Diagnosis not present

## 2018-06-16 DIAGNOSIS — J45909 Unspecified asthma, uncomplicated: Secondary | ICD-10-CM | POA: Diagnosis not present

## 2018-06-16 DIAGNOSIS — I10 Essential (primary) hypertension: Secondary | ICD-10-CM | POA: Diagnosis not present

## 2018-06-16 DIAGNOSIS — E039 Hypothyroidism, unspecified: Secondary | ICD-10-CM | POA: Diagnosis not present

## 2018-06-16 DIAGNOSIS — E785 Hyperlipidemia, unspecified: Secondary | ICD-10-CM | POA: Diagnosis not present

## 2018-07-13 ENCOUNTER — Encounter: Payer: Self-pay | Admitting: Internal Medicine

## 2019-01-21 DIAGNOSIS — Z0001 Encounter for general adult medical examination with abnormal findings: Secondary | ICD-10-CM | POA: Diagnosis not present

## 2019-01-21 DIAGNOSIS — E782 Mixed hyperlipidemia: Secondary | ICD-10-CM | POA: Diagnosis not present

## 2019-01-21 DIAGNOSIS — J4530 Mild persistent asthma, uncomplicated: Secondary | ICD-10-CM | POA: Diagnosis not present

## 2019-01-21 DIAGNOSIS — Z131 Encounter for screening for diabetes mellitus: Secondary | ICD-10-CM | POA: Diagnosis not present

## 2019-01-21 DIAGNOSIS — Z01118 Encounter for examination of ears and hearing with other abnormal findings: Secondary | ICD-10-CM | POA: Diagnosis not present

## 2019-01-21 DIAGNOSIS — Z01021 Encounter for examination of eyes and vision following failed vision screening with abnormal findings: Secondary | ICD-10-CM | POA: Diagnosis not present

## 2019-01-21 DIAGNOSIS — I10 Essential (primary) hypertension: Secondary | ICD-10-CM | POA: Diagnosis not present

## 2019-02-04 DIAGNOSIS — E039 Hypothyroidism, unspecified: Secondary | ICD-10-CM | POA: Diagnosis not present

## 2019-02-04 DIAGNOSIS — Z0001 Encounter for general adult medical examination with abnormal findings: Secondary | ICD-10-CM | POA: Diagnosis not present

## 2019-02-04 DIAGNOSIS — I10 Essential (primary) hypertension: Secondary | ICD-10-CM | POA: Diagnosis not present

## 2019-02-04 DIAGNOSIS — J4530 Mild persistent asthma, uncomplicated: Secondary | ICD-10-CM | POA: Diagnosis not present

## 2019-02-04 DIAGNOSIS — E782 Mixed hyperlipidemia: Secondary | ICD-10-CM | POA: Diagnosis not present

## 2019-02-25 DIAGNOSIS — Z720 Tobacco use: Secondary | ICD-10-CM | POA: Diagnosis not present

## 2019-02-25 DIAGNOSIS — I10 Essential (primary) hypertension: Secondary | ICD-10-CM | POA: Diagnosis not present

## 2019-02-25 DIAGNOSIS — J4530 Mild persistent asthma, uncomplicated: Secondary | ICD-10-CM | POA: Diagnosis not present

## 2019-02-25 DIAGNOSIS — E782 Mixed hyperlipidemia: Secondary | ICD-10-CM | POA: Diagnosis not present

## 2019-02-25 DIAGNOSIS — E039 Hypothyroidism, unspecified: Secondary | ICD-10-CM | POA: Diagnosis not present

## 2019-02-25 DIAGNOSIS — Z7689 Persons encountering health services in other specified circumstances: Secondary | ICD-10-CM | POA: Diagnosis not present

## 2019-03-02 ENCOUNTER — Encounter: Payer: Self-pay | Admitting: Internal Medicine

## 2019-03-11 DIAGNOSIS — J4530 Mild persistent asthma, uncomplicated: Secondary | ICD-10-CM | POA: Diagnosis not present

## 2019-03-11 DIAGNOSIS — E039 Hypothyroidism, unspecified: Secondary | ICD-10-CM | POA: Diagnosis not present

## 2019-03-11 DIAGNOSIS — I10 Essential (primary) hypertension: Secondary | ICD-10-CM | POA: Diagnosis not present

## 2019-03-11 DIAGNOSIS — E782 Mixed hyperlipidemia: Secondary | ICD-10-CM | POA: Diagnosis not present

## 2019-03-11 DIAGNOSIS — Z720 Tobacco use: Secondary | ICD-10-CM | POA: Diagnosis not present

## 2019-03-15 DIAGNOSIS — E559 Vitamin D deficiency, unspecified: Secondary | ICD-10-CM | POA: Diagnosis not present

## 2019-03-15 DIAGNOSIS — I10 Essential (primary) hypertension: Secondary | ICD-10-CM | POA: Diagnosis not present

## 2019-03-15 DIAGNOSIS — E782 Mixed hyperlipidemia: Secondary | ICD-10-CM | POA: Diagnosis not present

## 2019-03-15 DIAGNOSIS — E039 Hypothyroidism, unspecified: Secondary | ICD-10-CM | POA: Diagnosis not present

## 2019-03-15 DIAGNOSIS — J4530 Mild persistent asthma, uncomplicated: Secondary | ICD-10-CM | POA: Diagnosis not present

## 2019-03-25 DIAGNOSIS — J4530 Mild persistent asthma, uncomplicated: Secondary | ICD-10-CM | POA: Diagnosis not present

## 2019-03-25 DIAGNOSIS — I10 Essential (primary) hypertension: Secondary | ICD-10-CM | POA: Diagnosis not present

## 2019-03-25 DIAGNOSIS — E782 Mixed hyperlipidemia: Secondary | ICD-10-CM | POA: Diagnosis not present

## 2019-03-25 DIAGNOSIS — E039 Hypothyroidism, unspecified: Secondary | ICD-10-CM | POA: Diagnosis not present

## 2019-03-25 DIAGNOSIS — E559 Vitamin D deficiency, unspecified: Secondary | ICD-10-CM | POA: Diagnosis not present

## 2019-04-13 ENCOUNTER — Other Ambulatory Visit: Payer: Self-pay

## 2019-04-13 DIAGNOSIS — R0989 Other specified symptoms and signs involving the circulatory and respiratory systems: Secondary | ICD-10-CM

## 2019-04-15 ENCOUNTER — Encounter: Payer: Self-pay | Admitting: Vascular Surgery

## 2019-04-15 ENCOUNTER — Other Ambulatory Visit: Payer: Self-pay

## 2019-04-15 ENCOUNTER — Ambulatory Visit (INDEPENDENT_AMBULATORY_CARE_PROVIDER_SITE_OTHER): Payer: Medicare Other | Admitting: Vascular Surgery

## 2019-04-15 ENCOUNTER — Ambulatory Visit (HOSPITAL_COMMUNITY)
Admission: RE | Admit: 2019-04-15 | Discharge: 2019-04-15 | Disposition: A | Discharge status: Home / Self Care | Payer: Medicare Other | Source: Ambulatory Visit | Attending: Family | Admitting: Family

## 2019-04-15 VITALS — BP 118/82 | HR 85 | Temp 97.5°F | Resp 18 | Ht 65.0 in | Wt 112.9 lb

## 2019-04-15 DIAGNOSIS — I739 Peripheral vascular disease, unspecified: Secondary | ICD-10-CM | POA: Diagnosis not present

## 2019-04-15 DIAGNOSIS — R0989 Other specified symptoms and signs involving the circulatory and respiratory systems: Secondary | ICD-10-CM | POA: Diagnosis not present

## 2019-04-15 NOTE — Progress Notes (Signed)
Referring Physician: Dr Julio Sickssei Bonsu  Patient name: Taylor Bass MRN: [id number] DOB: 06/06/1934 Sex: female  REASON FOR CONSULT: Decreased pulses in leg  HPI: Taylor Bass is a 85 y.o. female, with a lengthy smoking history.  She was sent for abnormal pulse exam.  She does not have any symptoms of claudication.  She ambulates a minimal amount only from her bedroom to the kitchen and back.  She lives with her daughter.  Her daughter performs most of the care for her.  She overall appears fairly frail.  She currently does smoke a pack of cigarettes per day.  Greater than 3 minutes today spent regarding smoking cessation counseling.  She does not have any nonhealing wounds.  She does not describe rest pain.  She walks with a cane.  She is on aspirin and a statin.  Other medical problems include hypertension thrombocytopenia both of which are currently stable.  Past Medical History:  Diagnosis Date  . Hypertension   . Hypothyroidism   . Sepsis (HCC)   . Thrombocytopenia (HCC)   . Tobacco use    Past Surgical History:  Procedure Laterality Date  . ERCP N/A 02/22/2016   Procedure: ENDOSCOPIC RETROGRADE CHOLANGIOPANCREATOGRAPHY (ERCP);  Surgeon: Vida RiggerMarc Magod, MD;  [facility];  Service: Gastroenterology;  Laterality: N/A;    Family History  Problem Relation Age of Onset  . CAD Father   . Hypertension Sister     SOCIAL HISTORY: Social History   Socioeconomic History  . Marital status: Single    Spouse name: Not on file  . Number of children: Not on file  . Years of education: Not on file  . Highest education level: Not on file  Occupational History  . Not on file  Social Needs  . Financial resource strain: Not on file  . Food insecurity    Worry: Not on file    Inability: Not on file  . Transportation needs    Medical: Not on file    Non-medical: Not on file  Tobacco Use  . Smoking status: Current Every Day Smoker    Packs/day: 1.00    Types: Cigarettes  .  Smokeless tobacco: Never Used  Substance and Sexual Activity  . Alcohol use: No  . Drug use: No  . Sexual activity: Not on file  Lifestyle  . Physical activity    Days per week: Not on file    Minutes per session: Not on file  . Stress: Not on file  Relationships  . Social Musicianconnections    Talks on phone: Not on file    Gets together: Not on file    Attends religious service: Not on file    Active member of club or organization: Not on file    Attends meetings of clubs or organizations: Not on file    Relationship status: Not on file  . Intimate partner violence    Fear of current or ex partner: Not on file    Emotionally abused: Not on file    Physically abused: Not on file    Forced sexual activity: Not on file  Other Topics Concern  . Not on file  Social History Narrative   ** Merged History Encounter **        No Known Allergies  Current Outpatient Medications  Medication Sig Dispense Refill  . amLODipine (NORVASC) 2.5 MG tablet TK 1 T PO ONCE D    . aspirin EC 81 MG tablet Take 81 mg  by mouth daily.    Marland Kitchen atorvastatin (LIPITOR) 80 MG tablet Take 80 mg by mouth every evening.  1  . bifidobacterium infantis (ALIGN) capsule Take 1 capsule by mouth daily. 30 capsule 0  . feeding supplement, ENSURE ENLIVE, (ENSURE ENLIVE) LIQD Take 237 mLs by mouth 2 (two) times daily between meals. 237 mL 12  . hydrochlorothiazide (HYDRODIURIL) 25 MG tablet TK 1 T PO ONCE A DAY    . levothyroxine (SYNTHROID) 125 MCG tablet Take 125 mcg by mouth daily before breakfast.    . mirtazapine (REMERON) 15 MG tablet Take 15 mg by mouth at bedtime.  2  . potassium chloride (K-DUR) 10 MEQ tablet Take 10 mEq by mouth daily.  1  . PROAIR HFA 108 (90 Base) MCG/ACT inhaler Inhale 2 puffs into the lungs 4 (four) times daily as needed for wheezing or shortness of breath.  3  . urea 10 % lotion Apply 1 application topically 2 (two) times daily.  3   No current facility-administered medications for this  visit.     ROS:   General:  No weight loss, Fever, chills  HEENT: No recent headaches, no nasal bleeding, no visual changes, no sore throat  Neurologic: No dizziness, blackouts, seizures. No recent symptoms of stroke or mini- stroke. No recent episodes of slurred speech, or temporary blindness.  Cardiac: No recent episodes of chest pain/pressure, no shortness of breath at rest.  + shortness of breath with exertion.  Denies history of atrial fibrillation or irregular heartbeat  Vascular: No history of rest pain in feet.  No history of claudication.  No history of non-healing ulcer, No history of DVT   Pulmonary: No home oxygen, no productive cough, no hemoptysis,  No asthma or wheezing  Musculoskeletal:  [X]  Arthritis, [ ]  Low back pain,  [X]  Joint pain  Hematologic:No history of hypercoagulable state.  No history of easy bleeding.  No history of anemia  Gastrointestinal: No hematochezia or melena,  No gastroesophageal reflux, no trouble swallowing  Urinary: [ ]  chronic Kidney disease, [ ]  on HD - [ ]  MWF or [ ]  TTHS, [ ]  Burning with urination, [ ]  Frequent urination, [ ]  Difficulty urinating;   Skin: No rashes  Psychological: No history of anxiety,  No history of depression   Physical Examination  Vitals:   04/15/19 0934  BP: 118/82  Pulse: 85  Resp: 18  Temp: (!) 97.5 F (36.4 C)  SpO2: 96%  Weight: 112 lb 14.4 oz (51.2 kg)  Height: 5\' 5"  (1.651 m)    Body mass index is 18.79 kg/m.  General:  Alert and oriented, no acute distress HEENT: Normal Neck: No JVD Pulmonary: Clear to auscultation bilaterally Cardiac: Regular Rate and Rhythm  Abdomen: Soft, non-tender, non-distended, no mass Skin: No rash, no ulcer Extremity Pulses:  2+ radial, brachial, femoral, absent popliteal dorsalis pedis, posterior tibial pulses bilaterally Musculoskeletal: No deformity or edema  Neurologic: Upper and lower extremity motor 5/5 and symmetric  DATA:  Patient had bilateral ABIs  performed today which were 0.5 on the right 0.8 on the left  ASSESSMENT: Patient with peripheral arterial disease.  She is currently asymptomatic.  She has reasonable perfusion in the left leg.  She has marginal perfusion in the right leg.  However, she is currently asymptomatic.  I discussed smoking cessation with the patient and her son today.  Also discussed signs and symptoms of deteriorating perfusion such as claudication symptoms rest pain or nonhealing wounds.   PLAN: Patient  will follow-up in our APP clinic in 1 year with repeat ABIs.  She will follow-up sooner if she develops any symptoms of claudication rest pain or nonhealing wounds.  She will try to quit smoking.  Fabienne Bruns, MD [facility] Office: [phone] Pager: [phone]

## 2019-07-12 DIAGNOSIS — S299XXA Unspecified injury of thorax, initial encounter: Secondary | ICD-10-CM | POA: Diagnosis not present

## 2019-07-12 DIAGNOSIS — R0902 Hypoxemia: Secondary | ICD-10-CM | POA: Diagnosis not present

## 2019-07-12 DIAGNOSIS — S3993XA Unspecified injury of pelvis, initial encounter: Secondary | ICD-10-CM | POA: Diagnosis not present

## 2019-07-12 DIAGNOSIS — R2681 Unsteadiness on feet: Secondary | ICD-10-CM | POA: Diagnosis not present

## 2019-07-12 DIAGNOSIS — Z743 Need for continuous supervision: Secondary | ICD-10-CM | POA: Diagnosis not present

## 2019-07-12 DIAGNOSIS — M5136 Other intervertebral disc degeneration, lumbar region: Secondary | ICD-10-CM | POA: Diagnosis not present

## 2019-07-12 DIAGNOSIS — S3991XA Unspecified injury of abdomen, initial encounter: Secondary | ICD-10-CM | POA: Diagnosis not present

## 2019-07-12 DIAGNOSIS — S199XXA Unspecified injury of neck, initial encounter: Secondary | ICD-10-CM | POA: Diagnosis not present

## 2019-07-12 DIAGNOSIS — D649 Anemia, unspecified: Secondary | ICD-10-CM | POA: Diagnosis not present

## 2019-07-12 DIAGNOSIS — T23311A Burn of third degree of right thumb (nail), initial encounter: Secondary | ICD-10-CM | POA: Diagnosis not present

## 2019-07-12 DIAGNOSIS — T2029XA Burn of second degree of multiple sites of head, face, and neck, initial encounter: Secondary | ICD-10-CM | POA: Diagnosis not present

## 2019-07-12 DIAGNOSIS — I351 Nonrheumatic aortic (valve) insufficiency: Secondary | ICD-10-CM | POA: Diagnosis not present

## 2019-07-12 DIAGNOSIS — T2020XD Burn of second degree of head, face, and neck, unspecified site, subsequent encounter: Secondary | ICD-10-CM | POA: Diagnosis not present

## 2019-07-12 DIAGNOSIS — R5381 Other malaise: Secondary | ICD-10-CM | POA: Diagnosis not present

## 2019-07-12 DIAGNOSIS — E119 Type 2 diabetes mellitus without complications: Secondary | ICD-10-CM | POA: Diagnosis not present

## 2019-07-12 DIAGNOSIS — I10 Essential (primary) hypertension: Secondary | ICD-10-CM | POA: Diagnosis not present

## 2019-07-12 DIAGNOSIS — T2030XA Burn of third degree of head, face, and neck, unspecified site, initial encounter: Secondary | ICD-10-CM | POA: Diagnosis not present

## 2019-07-12 DIAGNOSIS — M6281 Muscle weakness (generalized): Secondary | ICD-10-CM | POA: Diagnosis not present

## 2019-07-12 DIAGNOSIS — T22312A Burn of third degree of left forearm, initial encounter: Secondary | ICD-10-CM | POA: Diagnosis not present

## 2019-07-12 DIAGNOSIS — S0003XA Contusion of scalp, initial encounter: Secondary | ICD-10-CM | POA: Diagnosis not present

## 2019-07-12 DIAGNOSIS — R279 Unspecified lack of coordination: Secondary | ICD-10-CM | POA: Diagnosis not present

## 2019-07-12 DIAGNOSIS — Z2821 Immunization not carried out because of patient refusal: Secondary | ICD-10-CM | POA: Diagnosis not present

## 2019-07-12 DIAGNOSIS — T300 Burn of unspecified body region, unspecified degree: Secondary | ICD-10-CM | POA: Diagnosis not present

## 2019-07-12 DIAGNOSIS — R000 Tachycardia, unspecified: Secondary | ICD-10-CM | POA: Diagnosis not present

## 2019-07-12 DIAGNOSIS — T23362A Burn of third degree of back of left hand, initial encounter: Secondary | ICD-10-CM | POA: Diagnosis not present

## 2019-07-12 DIAGNOSIS — R0600 Dyspnea, unspecified: Secondary | ICD-10-CM | POA: Diagnosis not present

## 2019-07-12 DIAGNOSIS — T24312A Burn of third degree of left thigh, initial encounter: Secondary | ICD-10-CM | POA: Diagnosis not present

## 2019-07-12 DIAGNOSIS — S3992XA Unspecified injury of lower back, initial encounter: Secondary | ICD-10-CM | POA: Diagnosis not present

## 2019-07-12 DIAGNOSIS — R1312 Dysphagia, oropharyngeal phase: Secondary | ICD-10-CM | POA: Diagnosis not present

## 2019-07-12 DIAGNOSIS — E039 Hypothyroidism, unspecified: Secondary | ICD-10-CM | POA: Diagnosis not present

## 2019-07-12 DIAGNOSIS — T24002D Burn of unspecified degree of unspecified site of left lower limb, except ankle and foot, subsequent encounter: Secondary | ICD-10-CM | POA: Diagnosis not present

## 2019-07-12 DIAGNOSIS — T20211A Burn of second degree of right ear [any part, except ear drum], initial encounter: Secondary | ICD-10-CM | POA: Diagnosis not present

## 2019-07-12 DIAGNOSIS — T20212A Burn of second degree of left ear [any part, except ear drum], initial encounter: Secondary | ICD-10-CM | POA: Diagnosis not present

## 2019-07-12 DIAGNOSIS — R918 Other nonspecific abnormal finding of lung field: Secondary | ICD-10-CM | POA: Diagnosis not present

## 2019-07-12 DIAGNOSIS — T22311A Burn of third degree of right forearm, initial encounter: Secondary | ICD-10-CM | POA: Diagnosis not present

## 2019-07-12 DIAGNOSIS — T59811A Toxic effect of smoke, accidental (unintentional), initial encounter: Secondary | ICD-10-CM | POA: Diagnosis not present

## 2019-07-12 DIAGNOSIS — E1165 Type 2 diabetes mellitus with hyperglycemia: Secondary | ICD-10-CM | POA: Diagnosis not present

## 2019-07-12 DIAGNOSIS — T23002D Burn of unspecified degree of left hand, unspecified site, subsequent encounter: Secondary | ICD-10-CM | POA: Diagnosis not present

## 2019-07-12 DIAGNOSIS — R54 Age-related physical debility: Secondary | ICD-10-CM | POA: Diagnosis not present

## 2019-07-12 DIAGNOSIS — T2230XA Burn of third degree of shoulder and upper limb, except wrist and hand, unspecified site, initial encounter: Secondary | ICD-10-CM | POA: Diagnosis not present

## 2019-07-12 DIAGNOSIS — T310 Burns involving less than 10% of body surface: Secondary | ICD-10-CM | POA: Diagnosis not present

## 2019-07-12 DIAGNOSIS — M5021 Other cervical disc displacement,  high cervical region: Secondary | ICD-10-CM | POA: Diagnosis not present

## 2019-07-22 DIAGNOSIS — T2020XA Burn of second degree of head, face, and neck, unspecified site, initial encounter: Secondary | ICD-10-CM | POA: Diagnosis not present

## 2019-07-22 DIAGNOSIS — U071 COVID-19: Secondary | ICD-10-CM | POA: Diagnosis not present

## 2019-07-22 DIAGNOSIS — T310 Burns involving less than 10% of body surface: Secondary | ICD-10-CM | POA: Diagnosis not present

## 2019-07-22 DIAGNOSIS — Z1159 Encounter for screening for other viral diseases: Secondary | ICD-10-CM | POA: Diagnosis not present

## 2019-07-22 DIAGNOSIS — R279 Unspecified lack of coordination: Secondary | ICD-10-CM | POA: Diagnosis not present

## 2019-07-22 DIAGNOSIS — T23002A Burn of unspecified degree of left hand, unspecified site, initial encounter: Secondary | ICD-10-CM | POA: Diagnosis not present

## 2019-07-22 DIAGNOSIS — R2681 Unsteadiness on feet: Secondary | ICD-10-CM | POA: Diagnosis not present

## 2019-07-22 DIAGNOSIS — T300 Burn of unspecified body region, unspecified degree: Secondary | ICD-10-CM | POA: Diagnosis not present

## 2019-07-22 DIAGNOSIS — I10 Essential (primary) hypertension: Secondary | ICD-10-CM | POA: Diagnosis not present

## 2019-07-22 DIAGNOSIS — T24012A Burn of unspecified degree of left thigh, initial encounter: Secondary | ICD-10-CM | POA: Diagnosis not present

## 2019-07-22 DIAGNOSIS — T2030XD Burn of third degree of head, face, and neck, unspecified site, subsequent encounter: Secondary | ICD-10-CM | POA: Diagnosis not present

## 2019-07-22 DIAGNOSIS — T22012A Burn of unspecified degree of left forearm, initial encounter: Secondary | ICD-10-CM | POA: Diagnosis not present

## 2019-07-22 DIAGNOSIS — E039 Hypothyroidism, unspecified: Secondary | ICD-10-CM | POA: Diagnosis not present

## 2019-07-22 DIAGNOSIS — D649 Anemia, unspecified: Secondary | ICD-10-CM | POA: Diagnosis not present

## 2019-07-22 DIAGNOSIS — R5381 Other malaise: Secondary | ICD-10-CM | POA: Diagnosis not present

## 2019-07-22 DIAGNOSIS — T23002D Burn of unspecified degree of left hand, unspecified site, subsequent encounter: Secondary | ICD-10-CM | POA: Diagnosis not present

## 2019-07-22 DIAGNOSIS — T24002D Burn of unspecified degree of unspecified site of left lower limb, except ankle and foot, subsequent encounter: Secondary | ICD-10-CM | POA: Diagnosis not present

## 2019-07-22 DIAGNOSIS — T148XXA Other injury of unspecified body region, initial encounter: Secondary | ICD-10-CM | POA: Diagnosis not present

## 2019-07-22 DIAGNOSIS — T22311D Burn of third degree of right forearm, subsequent encounter: Secondary | ICD-10-CM | POA: Diagnosis not present

## 2019-07-22 DIAGNOSIS — T22011A Burn of unspecified degree of right forearm, initial encounter: Secondary | ICD-10-CM | POA: Diagnosis not present

## 2019-07-22 DIAGNOSIS — T24312D Burn of third degree of left thigh, subsequent encounter: Secondary | ICD-10-CM | POA: Diagnosis not present

## 2019-07-22 DIAGNOSIS — T2020XD Burn of second degree of head, face, and neck, unspecified site, subsequent encounter: Secondary | ICD-10-CM | POA: Diagnosis not present

## 2019-07-22 DIAGNOSIS — M6281 Muscle weakness (generalized): Secondary | ICD-10-CM | POA: Diagnosis not present

## 2019-07-22 DIAGNOSIS — R1312 Dysphagia, oropharyngeal phase: Secondary | ICD-10-CM | POA: Diagnosis not present

## 2019-07-28 DIAGNOSIS — T300 Burn of unspecified body region, unspecified degree: Secondary | ICD-10-CM | POA: Diagnosis not present

## 2019-07-28 DIAGNOSIS — T148XXA Other injury of unspecified body region, initial encounter: Secondary | ICD-10-CM | POA: Diagnosis not present

## 2019-07-28 DIAGNOSIS — E039 Hypothyroidism, unspecified: Secondary | ICD-10-CM | POA: Diagnosis not present

## 2019-07-28 DIAGNOSIS — I10 Essential (primary) hypertension: Secondary | ICD-10-CM | POA: Diagnosis not present

## 2019-08-02 DIAGNOSIS — T2020XA Burn of second degree of head, face, and neck, unspecified site, initial encounter: Secondary | ICD-10-CM | POA: Diagnosis not present

## 2019-08-02 DIAGNOSIS — T24012A Burn of unspecified degree of left thigh, initial encounter: Secondary | ICD-10-CM | POA: Diagnosis not present

## 2019-08-02 DIAGNOSIS — T22012A Burn of unspecified degree of left forearm, initial encounter: Secondary | ICD-10-CM | POA: Diagnosis not present

## 2019-08-02 DIAGNOSIS — T23002A Burn of unspecified degree of left hand, unspecified site, initial encounter: Secondary | ICD-10-CM | POA: Diagnosis not present

## 2019-08-02 DIAGNOSIS — T22011A Burn of unspecified degree of right forearm, initial encounter: Secondary | ICD-10-CM | POA: Diagnosis not present

## 2019-08-04 DIAGNOSIS — T2030XD Burn of third degree of head, face, and neck, unspecified site, subsequent encounter: Secondary | ICD-10-CM | POA: Diagnosis not present

## 2019-08-04 DIAGNOSIS — T310 Burns involving less than 10% of body surface: Secondary | ICD-10-CM | POA: Diagnosis not present

## 2019-08-04 DIAGNOSIS — T22311D Burn of third degree of right forearm, subsequent encounter: Secondary | ICD-10-CM | POA: Diagnosis not present

## 2019-08-04 DIAGNOSIS — T24312D Burn of third degree of left thigh, subsequent encounter: Secondary | ICD-10-CM | POA: Diagnosis not present

## 2019-08-09 DIAGNOSIS — D649 Anemia, unspecified: Secondary | ICD-10-CM | POA: Diagnosis not present

## 2019-08-09 DIAGNOSIS — R1312 Dysphagia, oropharyngeal phase: Secondary | ICD-10-CM | POA: Diagnosis not present

## 2019-08-09 DIAGNOSIS — T310 Burns involving less than 10% of body surface: Secondary | ICD-10-CM | POA: Diagnosis not present

## 2019-08-09 DIAGNOSIS — I10 Essential (primary) hypertension: Secondary | ICD-10-CM | POA: Diagnosis not present

## 2019-08-09 DIAGNOSIS — U071 COVID-19: Secondary | ICD-10-CM | POA: Diagnosis not present

## 2019-08-27 DIAGNOSIS — T24312D Burn of third degree of left thigh, subsequent encounter: Secondary | ICD-10-CM | POA: Diagnosis not present

## 2019-08-27 DIAGNOSIS — T22012A Burn of unspecified degree of left forearm, initial encounter: Secondary | ICD-10-CM | POA: Diagnosis not present

## 2019-08-27 DIAGNOSIS — T310 Burns involving less than 10% of body surface: Secondary | ICD-10-CM | POA: Diagnosis not present

## 2019-08-27 DIAGNOSIS — T22312D Burn of third degree of left forearm, subsequent encounter: Secondary | ICD-10-CM | POA: Diagnosis not present

## 2019-08-27 DIAGNOSIS — T22311D Burn of third degree of right forearm, subsequent encounter: Secondary | ICD-10-CM | POA: Diagnosis not present

## 2019-08-27 DIAGNOSIS — T22011A Burn of unspecified degree of right forearm, initial encounter: Secondary | ICD-10-CM | POA: Diagnosis not present

## 2019-08-27 DIAGNOSIS — U071 COVID-19: Secondary | ICD-10-CM | POA: Diagnosis not present

## 2019-08-27 DIAGNOSIS — T23002A Burn of unspecified degree of left hand, unspecified site, initial encounter: Secondary | ICD-10-CM | POA: Diagnosis not present

## 2019-08-27 DIAGNOSIS — T2026XD Burn of second degree of forehead and cheek, subsequent encounter: Secondary | ICD-10-CM | POA: Diagnosis not present

## 2019-08-27 DIAGNOSIS — T2020XA Burn of second degree of head, face, and neck, unspecified site, initial encounter: Secondary | ICD-10-CM | POA: Diagnosis not present

## 2019-08-30 DIAGNOSIS — R531 Weakness: Secondary | ICD-10-CM | POA: Diagnosis not present

## 2019-09-06 DIAGNOSIS — R1312 Dysphagia, oropharyngeal phase: Secondary | ICD-10-CM | POA: Diagnosis not present

## 2019-09-07 DIAGNOSIS — R1312 Dysphagia, oropharyngeal phase: Secondary | ICD-10-CM | POA: Diagnosis not present

## 2019-09-08 DIAGNOSIS — R1312 Dysphagia, oropharyngeal phase: Secondary | ICD-10-CM | POA: Diagnosis not present

## 2019-09-09 DIAGNOSIS — R1312 Dysphagia, oropharyngeal phase: Secondary | ICD-10-CM | POA: Diagnosis not present

## 2019-09-10 DIAGNOSIS — T22311D Burn of third degree of right forearm, subsequent encounter: Secondary | ICD-10-CM | POA: Diagnosis not present

## 2019-09-10 DIAGNOSIS — T310 Burns involving less than 10% of body surface: Secondary | ICD-10-CM | POA: Diagnosis not present

## 2019-09-10 DIAGNOSIS — T22312D Burn of third degree of left forearm, subsequent encounter: Secondary | ICD-10-CM | POA: Diagnosis not present

## 2019-09-10 DIAGNOSIS — T2036XD Burn of third degree of forehead and cheek, subsequent encounter: Secondary | ICD-10-CM | POA: Diagnosis not present

## 2019-09-10 DIAGNOSIS — R1312 Dysphagia, oropharyngeal phase: Secondary | ICD-10-CM | POA: Diagnosis not present

## 2019-09-10 DIAGNOSIS — T23302D Burn of third degree of left hand, unspecified site, subsequent encounter: Secondary | ICD-10-CM | POA: Diagnosis not present

## 2019-09-13 DIAGNOSIS — E039 Hypothyroidism, unspecified: Secondary | ICD-10-CM | POA: Diagnosis not present

## 2019-09-13 DIAGNOSIS — R1312 Dysphagia, oropharyngeal phase: Secondary | ICD-10-CM | POA: Diagnosis not present

## 2019-09-23 DIAGNOSIS — K5900 Constipation, unspecified: Secondary | ICD-10-CM | POA: Diagnosis not present

## 2019-10-20 DIAGNOSIS — E039 Hypothyroidism, unspecified: Secondary | ICD-10-CM | POA: Diagnosis not present

## 2019-10-20 DIAGNOSIS — Z7409 Other reduced mobility: Secondary | ICD-10-CM | POA: Diagnosis not present

## 2019-10-20 DIAGNOSIS — I10 Essential (primary) hypertension: Secondary | ICD-10-CM | POA: Diagnosis not present

## 2019-10-21 DIAGNOSIS — E039 Hypothyroidism, unspecified: Secondary | ICD-10-CM | POA: Diagnosis not present

## 2019-10-26 DIAGNOSIS — E039 Hypothyroidism, unspecified: Secondary | ICD-10-CM | POA: Diagnosis not present

## 2019-11-02 DIAGNOSIS — T24002D Burn of unspecified degree of unspecified site of left lower limb, except ankle and foot, subsequent encounter: Secondary | ICD-10-CM | POA: Diagnosis not present

## 2019-11-02 DIAGNOSIS — T23002D Burn of unspecified degree of left hand, unspecified site, subsequent encounter: Secondary | ICD-10-CM | POA: Diagnosis not present

## 2019-11-02 DIAGNOSIS — I10 Essential (primary) hypertension: Secondary | ICD-10-CM | POA: Diagnosis not present

## 2019-11-02 DIAGNOSIS — D649 Anemia, unspecified: Secondary | ICD-10-CM | POA: Diagnosis not present

## 2019-11-03 DIAGNOSIS — T24002D Burn of unspecified degree of unspecified site of left lower limb, except ankle and foot, subsequent encounter: Secondary | ICD-10-CM | POA: Diagnosis not present

## 2019-11-03 DIAGNOSIS — T23002D Burn of unspecified degree of left hand, unspecified site, subsequent encounter: Secondary | ICD-10-CM | POA: Diagnosis not present

## 2019-11-03 DIAGNOSIS — I10 Essential (primary) hypertension: Secondary | ICD-10-CM | POA: Diagnosis not present

## 2019-11-03 DIAGNOSIS — D649 Anemia, unspecified: Secondary | ICD-10-CM | POA: Diagnosis not present

## 2019-11-04 DIAGNOSIS — T24002D Burn of unspecified degree of unspecified site of left lower limb, except ankle and foot, subsequent encounter: Secondary | ICD-10-CM | POA: Diagnosis not present

## 2019-11-04 DIAGNOSIS — I10 Essential (primary) hypertension: Secondary | ICD-10-CM | POA: Diagnosis not present

## 2019-11-04 DIAGNOSIS — T23002D Burn of unspecified degree of left hand, unspecified site, subsequent encounter: Secondary | ICD-10-CM | POA: Diagnosis not present

## 2019-11-04 DIAGNOSIS — D649 Anemia, unspecified: Secondary | ICD-10-CM | POA: Diagnosis not present

## 2019-11-05 DIAGNOSIS — T24002D Burn of unspecified degree of unspecified site of left lower limb, except ankle and foot, subsequent encounter: Secondary | ICD-10-CM | POA: Diagnosis not present

## 2019-11-05 DIAGNOSIS — T23002D Burn of unspecified degree of left hand, unspecified site, subsequent encounter: Secondary | ICD-10-CM | POA: Diagnosis not present

## 2019-11-05 DIAGNOSIS — I10 Essential (primary) hypertension: Secondary | ICD-10-CM | POA: Diagnosis not present

## 2019-11-05 DIAGNOSIS — D649 Anemia, unspecified: Secondary | ICD-10-CM | POA: Diagnosis not present

## 2019-11-08 DIAGNOSIS — T23002D Burn of unspecified degree of left hand, unspecified site, subsequent encounter: Secondary | ICD-10-CM | POA: Diagnosis not present

## 2019-11-08 DIAGNOSIS — T24002D Burn of unspecified degree of unspecified site of left lower limb, except ankle and foot, subsequent encounter: Secondary | ICD-10-CM | POA: Diagnosis not present

## 2019-11-08 DIAGNOSIS — I10 Essential (primary) hypertension: Secondary | ICD-10-CM | POA: Diagnosis not present

## 2019-11-08 DIAGNOSIS — D649 Anemia, unspecified: Secondary | ICD-10-CM | POA: Diagnosis not present

## 2019-11-09 DIAGNOSIS — I10 Essential (primary) hypertension: Secondary | ICD-10-CM | POA: Diagnosis not present

## 2019-11-09 DIAGNOSIS — D649 Anemia, unspecified: Secondary | ICD-10-CM | POA: Diagnosis not present

## 2019-11-09 DIAGNOSIS — T24002D Burn of unspecified degree of unspecified site of left lower limb, except ankle and foot, subsequent encounter: Secondary | ICD-10-CM | POA: Diagnosis not present

## 2019-11-09 DIAGNOSIS — T23002D Burn of unspecified degree of left hand, unspecified site, subsequent encounter: Secondary | ICD-10-CM | POA: Diagnosis not present

## 2019-11-10 DIAGNOSIS — T24002D Burn of unspecified degree of unspecified site of left lower limb, except ankle and foot, subsequent encounter: Secondary | ICD-10-CM | POA: Diagnosis not present

## 2019-11-10 DIAGNOSIS — T23002D Burn of unspecified degree of left hand, unspecified site, subsequent encounter: Secondary | ICD-10-CM | POA: Diagnosis not present

## 2019-11-10 DIAGNOSIS — D649 Anemia, unspecified: Secondary | ICD-10-CM | POA: Diagnosis not present

## 2019-11-10 DIAGNOSIS — I10 Essential (primary) hypertension: Secondary | ICD-10-CM | POA: Diagnosis not present

## 2019-11-11 DIAGNOSIS — T24002D Burn of unspecified degree of unspecified site of left lower limb, except ankle and foot, subsequent encounter: Secondary | ICD-10-CM | POA: Diagnosis not present

## 2019-11-11 DIAGNOSIS — T23002D Burn of unspecified degree of left hand, unspecified site, subsequent encounter: Secondary | ICD-10-CM | POA: Diagnosis not present

## 2019-11-11 DIAGNOSIS — D649 Anemia, unspecified: Secondary | ICD-10-CM | POA: Diagnosis not present

## 2019-11-11 DIAGNOSIS — I10 Essential (primary) hypertension: Secondary | ICD-10-CM | POA: Diagnosis not present

## 2019-11-12 DIAGNOSIS — T23002D Burn of unspecified degree of left hand, unspecified site, subsequent encounter: Secondary | ICD-10-CM | POA: Diagnosis not present

## 2019-11-12 DIAGNOSIS — I10 Essential (primary) hypertension: Secondary | ICD-10-CM | POA: Diagnosis not present

## 2019-11-12 DIAGNOSIS — T24002D Burn of unspecified degree of unspecified site of left lower limb, except ankle and foot, subsequent encounter: Secondary | ICD-10-CM | POA: Diagnosis not present

## 2019-11-12 DIAGNOSIS — D649 Anemia, unspecified: Secondary | ICD-10-CM | POA: Diagnosis not present

## 2019-11-15 DIAGNOSIS — D649 Anemia, unspecified: Secondary | ICD-10-CM | POA: Diagnosis not present

## 2019-11-15 DIAGNOSIS — T23002D Burn of unspecified degree of left hand, unspecified site, subsequent encounter: Secondary | ICD-10-CM | POA: Diagnosis not present

## 2019-11-15 DIAGNOSIS — I10 Essential (primary) hypertension: Secondary | ICD-10-CM | POA: Diagnosis not present

## 2019-11-15 DIAGNOSIS — T24002D Burn of unspecified degree of unspecified site of left lower limb, except ankle and foot, subsequent encounter: Secondary | ICD-10-CM | POA: Diagnosis not present

## 2019-11-16 DIAGNOSIS — D649 Anemia, unspecified: Secondary | ICD-10-CM | POA: Diagnosis not present

## 2019-11-16 DIAGNOSIS — T23002D Burn of unspecified degree of left hand, unspecified site, subsequent encounter: Secondary | ICD-10-CM | POA: Diagnosis not present

## 2019-11-16 DIAGNOSIS — I10 Essential (primary) hypertension: Secondary | ICD-10-CM | POA: Diagnosis not present

## 2019-11-16 DIAGNOSIS — T24002D Burn of unspecified degree of unspecified site of left lower limb, except ankle and foot, subsequent encounter: Secondary | ICD-10-CM | POA: Diagnosis not present

## 2019-11-17 DIAGNOSIS — I10 Essential (primary) hypertension: Secondary | ICD-10-CM | POA: Diagnosis not present

## 2019-11-17 DIAGNOSIS — T24002D Burn of unspecified degree of unspecified site of left lower limb, except ankle and foot, subsequent encounter: Secondary | ICD-10-CM | POA: Diagnosis not present

## 2019-11-17 DIAGNOSIS — T23002D Burn of unspecified degree of left hand, unspecified site, subsequent encounter: Secondary | ICD-10-CM | POA: Diagnosis not present

## 2019-11-17 DIAGNOSIS — D649 Anemia, unspecified: Secondary | ICD-10-CM | POA: Diagnosis not present

## 2019-11-18 DIAGNOSIS — I10 Essential (primary) hypertension: Secondary | ICD-10-CM | POA: Diagnosis not present

## 2019-11-18 DIAGNOSIS — T24002D Burn of unspecified degree of unspecified site of left lower limb, except ankle and foot, subsequent encounter: Secondary | ICD-10-CM | POA: Diagnosis not present

## 2019-11-18 DIAGNOSIS — D649 Anemia, unspecified: Secondary | ICD-10-CM | POA: Diagnosis not present

## 2019-11-18 DIAGNOSIS — T23002D Burn of unspecified degree of left hand, unspecified site, subsequent encounter: Secondary | ICD-10-CM | POA: Diagnosis not present

## 2019-11-19 DIAGNOSIS — I10 Essential (primary) hypertension: Secondary | ICD-10-CM | POA: Diagnosis not present

## 2019-11-19 DIAGNOSIS — T24002D Burn of unspecified degree of unspecified site of left lower limb, except ankle and foot, subsequent encounter: Secondary | ICD-10-CM | POA: Diagnosis not present

## 2019-11-19 DIAGNOSIS — T23002D Burn of unspecified degree of left hand, unspecified site, subsequent encounter: Secondary | ICD-10-CM | POA: Diagnosis not present

## 2019-11-19 DIAGNOSIS — D649 Anemia, unspecified: Secondary | ICD-10-CM | POA: Diagnosis not present

## 2019-11-22 DIAGNOSIS — T23002D Burn of unspecified degree of left hand, unspecified site, subsequent encounter: Secondary | ICD-10-CM | POA: Diagnosis not present

## 2019-11-22 DIAGNOSIS — T24002D Burn of unspecified degree of unspecified site of left lower limb, except ankle and foot, subsequent encounter: Secondary | ICD-10-CM | POA: Diagnosis not present

## 2019-11-22 DIAGNOSIS — I10 Essential (primary) hypertension: Secondary | ICD-10-CM | POA: Diagnosis not present

## 2019-11-22 DIAGNOSIS — D649 Anemia, unspecified: Secondary | ICD-10-CM | POA: Diagnosis not present

## 2019-11-23 DIAGNOSIS — T23002D Burn of unspecified degree of left hand, unspecified site, subsequent encounter: Secondary | ICD-10-CM | POA: Diagnosis not present

## 2019-11-23 DIAGNOSIS — I10 Essential (primary) hypertension: Secondary | ICD-10-CM | POA: Diagnosis not present

## 2019-11-23 DIAGNOSIS — T24002D Burn of unspecified degree of unspecified site of left lower limb, except ankle and foot, subsequent encounter: Secondary | ICD-10-CM | POA: Diagnosis not present

## 2019-11-23 DIAGNOSIS — D649 Anemia, unspecified: Secondary | ICD-10-CM | POA: Diagnosis not present

## 2019-11-24 DIAGNOSIS — T24002D Burn of unspecified degree of unspecified site of left lower limb, except ankle and foot, subsequent encounter: Secondary | ICD-10-CM | POA: Diagnosis not present

## 2019-11-24 DIAGNOSIS — D649 Anemia, unspecified: Secondary | ICD-10-CM | POA: Diagnosis not present

## 2019-11-24 DIAGNOSIS — T23002D Burn of unspecified degree of left hand, unspecified site, subsequent encounter: Secondary | ICD-10-CM | POA: Diagnosis not present

## 2019-11-24 DIAGNOSIS — I10 Essential (primary) hypertension: Secondary | ICD-10-CM | POA: Diagnosis not present

## 2019-11-25 DIAGNOSIS — T23002D Burn of unspecified degree of left hand, unspecified site, subsequent encounter: Secondary | ICD-10-CM | POA: Diagnosis not present

## 2019-11-25 DIAGNOSIS — I10 Essential (primary) hypertension: Secondary | ICD-10-CM | POA: Diagnosis not present

## 2019-11-25 DIAGNOSIS — D649 Anemia, unspecified: Secondary | ICD-10-CM | POA: Diagnosis not present

## 2019-11-25 DIAGNOSIS — T24002D Burn of unspecified degree of unspecified site of left lower limb, except ankle and foot, subsequent encounter: Secondary | ICD-10-CM | POA: Diagnosis not present

## 2019-11-26 DIAGNOSIS — T24002D Burn of unspecified degree of unspecified site of left lower limb, except ankle and foot, subsequent encounter: Secondary | ICD-10-CM | POA: Diagnosis not present

## 2019-11-26 DIAGNOSIS — T23002D Burn of unspecified degree of left hand, unspecified site, subsequent encounter: Secondary | ICD-10-CM | POA: Diagnosis not present

## 2019-11-26 DIAGNOSIS — D649 Anemia, unspecified: Secondary | ICD-10-CM | POA: Diagnosis not present

## 2019-11-26 DIAGNOSIS — I10 Essential (primary) hypertension: Secondary | ICD-10-CM | POA: Diagnosis not present

## 2019-11-29 DIAGNOSIS — M6281 Muscle weakness (generalized): Secondary | ICD-10-CM | POA: Diagnosis not present

## 2019-11-29 DIAGNOSIS — I10 Essential (primary) hypertension: Secondary | ICD-10-CM | POA: Diagnosis not present

## 2019-12-02 DIAGNOSIS — E039 Hypothyroidism, unspecified: Secondary | ICD-10-CM | POA: Diagnosis not present

## 2019-12-20 DIAGNOSIS — I10 Essential (primary) hypertension: Secondary | ICD-10-CM | POA: Diagnosis not present

## 2019-12-20 DIAGNOSIS — D649 Anemia, unspecified: Secondary | ICD-10-CM | POA: Diagnosis not present

## 2019-12-20 DIAGNOSIS — E039 Hypothyroidism, unspecified: Secondary | ICD-10-CM | POA: Diagnosis not present

## 2020-01-19 DIAGNOSIS — E039 Hypothyroidism, unspecified: Secondary | ICD-10-CM | POA: Diagnosis not present

## 2020-01-31 DIAGNOSIS — I10 Essential (primary) hypertension: Secondary | ICD-10-CM | POA: Diagnosis not present

## 2020-01-31 DIAGNOSIS — D649 Anemia, unspecified: Secondary | ICD-10-CM | POA: Diagnosis not present

## 2020-01-31 DIAGNOSIS — T23002D Burn of unspecified degree of left hand, unspecified site, subsequent encounter: Secondary | ICD-10-CM | POA: Diagnosis not present

## 2020-01-31 DIAGNOSIS — T24002D Burn of unspecified degree of unspecified site of left lower limb, except ankle and foot, subsequent encounter: Secondary | ICD-10-CM | POA: Diagnosis not present

## 2020-01-31 DIAGNOSIS — E039 Hypothyroidism, unspecified: Secondary | ICD-10-CM | POA: Diagnosis not present

## 2020-02-01 DIAGNOSIS — E039 Hypothyroidism, unspecified: Secondary | ICD-10-CM | POA: Diagnosis not present

## 2020-02-01 DIAGNOSIS — T24002D Burn of unspecified degree of unspecified site of left lower limb, except ankle and foot, subsequent encounter: Secondary | ICD-10-CM | POA: Diagnosis not present

## 2020-02-01 DIAGNOSIS — I10 Essential (primary) hypertension: Secondary | ICD-10-CM | POA: Diagnosis not present

## 2020-02-01 DIAGNOSIS — D649 Anemia, unspecified: Secondary | ICD-10-CM | POA: Diagnosis not present

## 2020-02-01 DIAGNOSIS — T23002D Burn of unspecified degree of left hand, unspecified site, subsequent encounter: Secondary | ICD-10-CM | POA: Diagnosis not present

## 2020-02-02 DIAGNOSIS — T23002D Burn of unspecified degree of left hand, unspecified site, subsequent encounter: Secondary | ICD-10-CM | POA: Diagnosis not present

## 2020-02-02 DIAGNOSIS — D649 Anemia, unspecified: Secondary | ICD-10-CM | POA: Diagnosis not present

## 2020-02-02 DIAGNOSIS — T24002D Burn of unspecified degree of unspecified site of left lower limb, except ankle and foot, subsequent encounter: Secondary | ICD-10-CM | POA: Diagnosis not present

## 2020-02-02 DIAGNOSIS — E039 Hypothyroidism, unspecified: Secondary | ICD-10-CM | POA: Diagnosis not present

## 2020-02-02 DIAGNOSIS — I10 Essential (primary) hypertension: Secondary | ICD-10-CM | POA: Diagnosis not present

## 2020-02-03 DIAGNOSIS — D649 Anemia, unspecified: Secondary | ICD-10-CM | POA: Diagnosis not present

## 2020-02-03 DIAGNOSIS — T24002D Burn of unspecified degree of unspecified site of left lower limb, except ankle and foot, subsequent encounter: Secondary | ICD-10-CM | POA: Diagnosis not present

## 2020-02-03 DIAGNOSIS — I10 Essential (primary) hypertension: Secondary | ICD-10-CM | POA: Diagnosis not present

## 2020-02-03 DIAGNOSIS — E039 Hypothyroidism, unspecified: Secondary | ICD-10-CM | POA: Diagnosis not present

## 2020-02-03 DIAGNOSIS — T23002D Burn of unspecified degree of left hand, unspecified site, subsequent encounter: Secondary | ICD-10-CM | POA: Diagnosis not present

## 2020-02-04 DIAGNOSIS — D649 Anemia, unspecified: Secondary | ICD-10-CM | POA: Diagnosis not present

## 2020-02-04 DIAGNOSIS — T24002D Burn of unspecified degree of unspecified site of left lower limb, except ankle and foot, subsequent encounter: Secondary | ICD-10-CM | POA: Diagnosis not present

## 2020-02-04 DIAGNOSIS — I10 Essential (primary) hypertension: Secondary | ICD-10-CM | POA: Diagnosis not present

## 2020-02-04 DIAGNOSIS — T24002A Burn of unspecified degree of unspecified site of left lower limb, except ankle and foot, initial encounter: Secondary | ICD-10-CM | POA: Diagnosis not present

## 2020-02-04 DIAGNOSIS — S0990XA Unspecified injury of head, initial encounter: Secondary | ICD-10-CM | POA: Diagnosis not present

## 2020-02-04 DIAGNOSIS — T23002D Burn of unspecified degree of left hand, unspecified site, subsequent encounter: Secondary | ICD-10-CM | POA: Diagnosis not present

## 2020-02-04 DIAGNOSIS — E039 Hypothyroidism, unspecified: Secondary | ICD-10-CM | POA: Diagnosis not present

## 2020-02-04 DIAGNOSIS — T23002A Burn of unspecified degree of left hand, unspecified site, initial encounter: Secondary | ICD-10-CM | POA: Diagnosis not present

## 2020-02-07 DIAGNOSIS — E039 Hypothyroidism, unspecified: Secondary | ICD-10-CM | POA: Diagnosis not present

## 2020-02-07 DIAGNOSIS — R7989 Other specified abnormal findings of blood chemistry: Secondary | ICD-10-CM | POA: Diagnosis not present

## 2020-02-07 DIAGNOSIS — Z03818 Encounter for observation for suspected exposure to other biological agents ruled out: Secondary | ICD-10-CM | POA: Diagnosis not present

## 2020-02-07 DIAGNOSIS — D649 Anemia, unspecified: Secondary | ICD-10-CM | POA: Diagnosis not present

## 2020-02-08 DIAGNOSIS — T23002A Burn of unspecified degree of left hand, unspecified site, initial encounter: Secondary | ICD-10-CM | POA: Diagnosis not present

## 2020-02-08 DIAGNOSIS — R2681 Unsteadiness on feet: Secondary | ICD-10-CM | POA: Diagnosis not present

## 2020-02-08 DIAGNOSIS — R262 Difficulty in walking, not elsewhere classified: Secondary | ICD-10-CM | POA: Diagnosis not present

## 2020-02-08 DIAGNOSIS — M6281 Muscle weakness (generalized): Secondary | ICD-10-CM | POA: Diagnosis not present

## 2020-02-08 DIAGNOSIS — R1312 Dysphagia, oropharyngeal phase: Secondary | ICD-10-CM | POA: Diagnosis not present

## 2020-02-09 DIAGNOSIS — M6281 Muscle weakness (generalized): Secondary | ICD-10-CM | POA: Diagnosis not present

## 2020-02-09 DIAGNOSIS — R2681 Unsteadiness on feet: Secondary | ICD-10-CM | POA: Diagnosis not present

## 2020-02-10 DIAGNOSIS — E039 Hypothyroidism, unspecified: Secondary | ICD-10-CM | POA: Diagnosis not present

## 2020-02-10 DIAGNOSIS — T23002A Burn of unspecified degree of left hand, unspecified site, initial encounter: Secondary | ICD-10-CM | POA: Diagnosis not present

## 2020-02-10 DIAGNOSIS — M6281 Muscle weakness (generalized): Secondary | ICD-10-CM | POA: Diagnosis not present

## 2020-02-10 DIAGNOSIS — I10 Essential (primary) hypertension: Secondary | ICD-10-CM | POA: Diagnosis not present

## 2020-02-10 DIAGNOSIS — D696 Thrombocytopenia, unspecified: Secondary | ICD-10-CM | POA: Diagnosis not present

## 2020-02-10 DIAGNOSIS — R2681 Unsteadiness on feet: Secondary | ICD-10-CM | POA: Diagnosis not present

## 2020-02-11 DIAGNOSIS — M6281 Muscle weakness (generalized): Secondary | ICD-10-CM | POA: Diagnosis not present

## 2020-02-11 DIAGNOSIS — R2681 Unsteadiness on feet: Secondary | ICD-10-CM | POA: Diagnosis not present

## 2020-02-12 DIAGNOSIS — M6281 Muscle weakness (generalized): Secondary | ICD-10-CM | POA: Diagnosis not present

## 2020-02-12 DIAGNOSIS — R2681 Unsteadiness on feet: Secondary | ICD-10-CM | POA: Diagnosis not present

## 2020-02-13 DIAGNOSIS — R2681 Unsteadiness on feet: Secondary | ICD-10-CM | POA: Diagnosis not present

## 2020-02-13 DIAGNOSIS — M6281 Muscle weakness (generalized): Secondary | ICD-10-CM | POA: Diagnosis not present

## 2020-02-14 DIAGNOSIS — M6281 Muscle weakness (generalized): Secondary | ICD-10-CM | POA: Diagnosis not present

## 2020-02-14 DIAGNOSIS — R2681 Unsteadiness on feet: Secondary | ICD-10-CM | POA: Diagnosis not present

## 2020-02-14 DIAGNOSIS — Z03818 Encounter for observation for suspected exposure to other biological agents ruled out: Secondary | ICD-10-CM | POA: Diagnosis not present

## 2020-02-15 DIAGNOSIS — M6281 Muscle weakness (generalized): Secondary | ICD-10-CM | POA: Diagnosis not present

## 2020-02-15 DIAGNOSIS — R2681 Unsteadiness on feet: Secondary | ICD-10-CM | POA: Diagnosis not present

## 2020-02-16 DIAGNOSIS — M6281 Muscle weakness (generalized): Secondary | ICD-10-CM | POA: Diagnosis not present

## 2020-02-16 DIAGNOSIS — R2681 Unsteadiness on feet: Secondary | ICD-10-CM | POA: Diagnosis not present

## 2020-02-17 DIAGNOSIS — R2681 Unsteadiness on feet: Secondary | ICD-10-CM | POA: Diagnosis not present

## 2020-02-17 DIAGNOSIS — M6281 Muscle weakness (generalized): Secondary | ICD-10-CM | POA: Diagnosis not present

## 2020-02-18 DIAGNOSIS — M6281 Muscle weakness (generalized): Secondary | ICD-10-CM | POA: Diagnosis not present

## 2020-02-18 DIAGNOSIS — R2681 Unsteadiness on feet: Secondary | ICD-10-CM | POA: Diagnosis not present

## 2020-02-19 DIAGNOSIS — R2681 Unsteadiness on feet: Secondary | ICD-10-CM | POA: Diagnosis not present

## 2020-02-19 DIAGNOSIS — M6281 Muscle weakness (generalized): Secondary | ICD-10-CM | POA: Diagnosis not present

## 2020-02-20 DIAGNOSIS — M6281 Muscle weakness (generalized): Secondary | ICD-10-CM | POA: Diagnosis not present

## 2020-02-20 DIAGNOSIS — R2681 Unsteadiness on feet: Secondary | ICD-10-CM | POA: Diagnosis not present

## 2020-02-21 DIAGNOSIS — Z03818 Encounter for observation for suspected exposure to other biological agents ruled out: Secondary | ICD-10-CM | POA: Diagnosis not present

## 2020-02-21 DIAGNOSIS — M6281 Muscle weakness (generalized): Secondary | ICD-10-CM | POA: Diagnosis not present

## 2020-02-21 DIAGNOSIS — R2681 Unsteadiness on feet: Secondary | ICD-10-CM | POA: Diagnosis not present

## 2020-02-22 DIAGNOSIS — M6281 Muscle weakness (generalized): Secondary | ICD-10-CM | POA: Diagnosis not present

## 2020-02-22 DIAGNOSIS — R2681 Unsteadiness on feet: Secondary | ICD-10-CM | POA: Diagnosis not present

## 2020-02-23 DIAGNOSIS — R1312 Dysphagia, oropharyngeal phase: Secondary | ICD-10-CM | POA: Diagnosis not present

## 2020-02-23 DIAGNOSIS — R262 Difficulty in walking, not elsewhere classified: Secondary | ICD-10-CM | POA: Diagnosis not present

## 2020-02-23 DIAGNOSIS — R2681 Unsteadiness on feet: Secondary | ICD-10-CM | POA: Diagnosis not present

## 2020-02-23 DIAGNOSIS — T23002A Burn of unspecified degree of left hand, unspecified site, initial encounter: Secondary | ICD-10-CM | POA: Diagnosis not present

## 2020-02-23 DIAGNOSIS — M6281 Muscle weakness (generalized): Secondary | ICD-10-CM | POA: Diagnosis not present

## 2020-02-23 DIAGNOSIS — I10 Essential (primary) hypertension: Secondary | ICD-10-CM | POA: Diagnosis not present

## 2020-02-24 DIAGNOSIS — M6281 Muscle weakness (generalized): Secondary | ICD-10-CM | POA: Diagnosis not present

## 2020-02-24 DIAGNOSIS — R2681 Unsteadiness on feet: Secondary | ICD-10-CM | POA: Diagnosis not present

## 2020-02-25 DIAGNOSIS — M6281 Muscle weakness (generalized): Secondary | ICD-10-CM | POA: Diagnosis not present

## 2020-02-25 DIAGNOSIS — R2681 Unsteadiness on feet: Secondary | ICD-10-CM | POA: Diagnosis not present

## 2020-02-26 DIAGNOSIS — M6281 Muscle weakness (generalized): Secondary | ICD-10-CM | POA: Diagnosis not present

## 2020-02-26 DIAGNOSIS — R2681 Unsteadiness on feet: Secondary | ICD-10-CM | POA: Diagnosis not present

## 2020-02-27 DIAGNOSIS — M6281 Muscle weakness (generalized): Secondary | ICD-10-CM | POA: Diagnosis not present

## 2020-02-27 DIAGNOSIS — R2681 Unsteadiness on feet: Secondary | ICD-10-CM | POA: Diagnosis not present

## 2020-02-28 DIAGNOSIS — R2681 Unsteadiness on feet: Secondary | ICD-10-CM | POA: Diagnosis not present

## 2020-02-28 DIAGNOSIS — M6281 Muscle weakness (generalized): Secondary | ICD-10-CM | POA: Diagnosis not present

## 2020-02-29 DIAGNOSIS — M6281 Muscle weakness (generalized): Secondary | ICD-10-CM | POA: Diagnosis not present

## 2020-02-29 DIAGNOSIS — R2681 Unsteadiness on feet: Secondary | ICD-10-CM | POA: Diagnosis not present

## 2020-03-01 DIAGNOSIS — E039 Hypothyroidism, unspecified: Secondary | ICD-10-CM | POA: Diagnosis not present

## 2020-03-01 DIAGNOSIS — M6281 Muscle weakness (generalized): Secondary | ICD-10-CM | POA: Diagnosis not present

## 2020-03-01 DIAGNOSIS — D649 Anemia, unspecified: Secondary | ICD-10-CM | POA: Diagnosis not present

## 2020-03-01 DIAGNOSIS — Z79899 Other long term (current) drug therapy: Secondary | ICD-10-CM | POA: Diagnosis not present

## 2020-03-01 DIAGNOSIS — R2681 Unsteadiness on feet: Secondary | ICD-10-CM | POA: Diagnosis not present

## 2020-03-02 DIAGNOSIS — M6281 Muscle weakness (generalized): Secondary | ICD-10-CM | POA: Diagnosis not present

## 2020-03-02 DIAGNOSIS — R2681 Unsteadiness on feet: Secondary | ICD-10-CM | POA: Diagnosis not present

## 2020-03-03 DIAGNOSIS — M6281 Muscle weakness (generalized): Secondary | ICD-10-CM | POA: Diagnosis not present

## 2020-03-03 DIAGNOSIS — R2681 Unsteadiness on feet: Secondary | ICD-10-CM | POA: Diagnosis not present

## 2020-03-04 DIAGNOSIS — M6281 Muscle weakness (generalized): Secondary | ICD-10-CM | POA: Diagnosis not present

## 2020-03-04 DIAGNOSIS — R2681 Unsteadiness on feet: Secondary | ICD-10-CM | POA: Diagnosis not present

## 2020-03-05 DIAGNOSIS — R2681 Unsteadiness on feet: Secondary | ICD-10-CM | POA: Diagnosis not present

## 2020-03-05 DIAGNOSIS — M6281 Muscle weakness (generalized): Secondary | ICD-10-CM | POA: Diagnosis not present

## 2020-03-06 DIAGNOSIS — R2681 Unsteadiness on feet: Secondary | ICD-10-CM | POA: Diagnosis not present

## 2020-03-06 DIAGNOSIS — M6281 Muscle weakness (generalized): Secondary | ICD-10-CM | POA: Diagnosis not present

## 2020-03-07 DIAGNOSIS — R2681 Unsteadiness on feet: Secondary | ICD-10-CM | POA: Diagnosis not present

## 2020-03-07 DIAGNOSIS — M6281 Muscle weakness (generalized): Secondary | ICD-10-CM | POA: Diagnosis not present

## 2020-03-08 DIAGNOSIS — M6281 Muscle weakness (generalized): Secondary | ICD-10-CM | POA: Diagnosis not present

## 2020-03-08 DIAGNOSIS — R2681 Unsteadiness on feet: Secondary | ICD-10-CM | POA: Diagnosis not present

## 2020-03-09 DIAGNOSIS — R2681 Unsteadiness on feet: Secondary | ICD-10-CM | POA: Diagnosis not present

## 2020-03-09 DIAGNOSIS — M6281 Muscle weakness (generalized): Secondary | ICD-10-CM | POA: Diagnosis not present

## 2020-03-10 DIAGNOSIS — R2681 Unsteadiness on feet: Secondary | ICD-10-CM | POA: Diagnosis not present

## 2020-03-10 DIAGNOSIS — T23002A Burn of unspecified degree of left hand, unspecified site, initial encounter: Secondary | ICD-10-CM | POA: Diagnosis not present

## 2020-03-10 DIAGNOSIS — M6281 Muscle weakness (generalized): Secondary | ICD-10-CM | POA: Diagnosis not present

## 2020-03-10 DIAGNOSIS — I10 Essential (primary) hypertension: Secondary | ICD-10-CM | POA: Diagnosis not present

## 2020-03-10 DIAGNOSIS — E039 Hypothyroidism, unspecified: Secondary | ICD-10-CM | POA: Diagnosis not present

## 2020-03-10 DIAGNOSIS — R262 Difficulty in walking, not elsewhere classified: Secondary | ICD-10-CM | POA: Diagnosis not present

## 2020-03-13 DIAGNOSIS — M6281 Muscle weakness (generalized): Secondary | ICD-10-CM | POA: Diagnosis not present

## 2020-03-13 DIAGNOSIS — R2681 Unsteadiness on feet: Secondary | ICD-10-CM | POA: Diagnosis not present

## 2020-03-14 DIAGNOSIS — M6281 Muscle weakness (generalized): Secondary | ICD-10-CM | POA: Diagnosis not present

## 2020-03-14 DIAGNOSIS — R2681 Unsteadiness on feet: Secondary | ICD-10-CM | POA: Diagnosis not present

## 2020-03-22 DIAGNOSIS — Z03818 Encounter for observation for suspected exposure to other biological agents ruled out: Secondary | ICD-10-CM | POA: Diagnosis not present

## 2020-03-29 DIAGNOSIS — Z03818 Encounter for observation for suspected exposure to other biological agents ruled out: Secondary | ICD-10-CM | POA: Diagnosis not present

## 2020-03-29 DIAGNOSIS — Z79899 Other long term (current) drug therapy: Secondary | ICD-10-CM | POA: Diagnosis not present

## 2020-03-30 DIAGNOSIS — E7089 Other disorders of aromatic amino-acid metabolism: Secondary | ICD-10-CM | POA: Diagnosis not present

## 2020-03-30 DIAGNOSIS — Z79899 Other long term (current) drug therapy: Secondary | ICD-10-CM | POA: Diagnosis not present

## 2020-03-31 DIAGNOSIS — I10 Essential (primary) hypertension: Secondary | ICD-10-CM | POA: Diagnosis not present

## 2020-03-31 DIAGNOSIS — Z79899 Other long term (current) drug therapy: Secondary | ICD-10-CM | POA: Diagnosis not present

## 2020-04-05 DIAGNOSIS — Z03818 Encounter for observation for suspected exposure to other biological agents ruled out: Secondary | ICD-10-CM | POA: Diagnosis not present

## 2020-04-12 DIAGNOSIS — Z03818 Encounter for observation for suspected exposure to other biological agents ruled out: Secondary | ICD-10-CM | POA: Diagnosis not present

## 2020-04-19 DIAGNOSIS — Z03818 Encounter for observation for suspected exposure to other biological agents ruled out: Secondary | ICD-10-CM | POA: Diagnosis not present

## 2020-04-20 DIAGNOSIS — K5900 Constipation, unspecified: Secondary | ICD-10-CM | POA: Diagnosis not present

## 2020-04-20 DIAGNOSIS — R262 Difficulty in walking, not elsewhere classified: Secondary | ICD-10-CM | POA: Diagnosis not present

## 2020-04-20 DIAGNOSIS — E039 Hypothyroidism, unspecified: Secondary | ICD-10-CM | POA: Diagnosis not present

## 2020-04-25 DIAGNOSIS — G3184 Mild cognitive impairment, so stated: Secondary | ICD-10-CM | POA: Diagnosis not present

## 2020-04-25 DIAGNOSIS — Z03818 Encounter for observation for suspected exposure to other biological agents ruled out: Secondary | ICD-10-CM | POA: Diagnosis not present

## 2020-05-02 DIAGNOSIS — Z03818 Encounter for observation for suspected exposure to other biological agents ruled out: Secondary | ICD-10-CM | POA: Diagnosis not present

## 2020-05-26 DIAGNOSIS — E039 Hypothyroidism, unspecified: Secondary | ICD-10-CM | POA: Diagnosis not present

## 2020-05-26 DIAGNOSIS — R262 Difficulty in walking, not elsewhere classified: Secondary | ICD-10-CM | POA: Diagnosis not present

## 2020-05-26 DIAGNOSIS — K5900 Constipation, unspecified: Secondary | ICD-10-CM | POA: Diagnosis not present

## 2020-05-29 DIAGNOSIS — Z03818 Encounter for observation for suspected exposure to other biological agents ruled out: Secondary | ICD-10-CM | POA: Diagnosis not present

## 2020-06-01 DIAGNOSIS — Z03818 Encounter for observation for suspected exposure to other biological agents ruled out: Secondary | ICD-10-CM | POA: Diagnosis not present

## 2020-06-20 DIAGNOSIS — E039 Hypothyroidism, unspecified: Secondary | ICD-10-CM | POA: Diagnosis not present

## 2020-06-20 DIAGNOSIS — I10 Essential (primary) hypertension: Secondary | ICD-10-CM | POA: Diagnosis not present

## 2020-06-20 DIAGNOSIS — R262 Difficulty in walking, not elsewhere classified: Secondary | ICD-10-CM | POA: Diagnosis not present

## 2020-08-17 DIAGNOSIS — D509 Iron deficiency anemia, unspecified: Secondary | ICD-10-CM | POA: Diagnosis not present

## 2020-08-17 DIAGNOSIS — R1312 Dysphagia, oropharyngeal phase: Secondary | ICD-10-CM | POA: Diagnosis not present

## 2020-08-17 DIAGNOSIS — I10 Essential (primary) hypertension: Secondary | ICD-10-CM | POA: Diagnosis not present

## 2020-08-17 DIAGNOSIS — E039 Hypothyroidism, unspecified: Secondary | ICD-10-CM | POA: Diagnosis not present

## 2020-08-17 DIAGNOSIS — R262 Difficulty in walking, not elsewhere classified: Secondary | ICD-10-CM | POA: Diagnosis not present

## 2020-08-17 DIAGNOSIS — K5904 Chronic idiopathic constipation: Secondary | ICD-10-CM | POA: Diagnosis not present

## 2020-08-30 DIAGNOSIS — M6281 Muscle weakness (generalized): Secondary | ICD-10-CM | POA: Diagnosis not present

## 2020-09-16 DIAGNOSIS — D509 Iron deficiency anemia, unspecified: Secondary | ICD-10-CM | POA: Diagnosis not present

## 2020-09-16 DIAGNOSIS — I10 Essential (primary) hypertension: Secondary | ICD-10-CM | POA: Diagnosis not present

## 2020-09-19 DIAGNOSIS — E559 Vitamin D deficiency, unspecified: Secondary | ICD-10-CM | POA: Diagnosis not present

## 2020-09-19 DIAGNOSIS — E119 Type 2 diabetes mellitus without complications: Secondary | ICD-10-CM | POA: Diagnosis not present

## 2020-09-19 DIAGNOSIS — D519 Vitamin B12 deficiency anemia, unspecified: Secondary | ICD-10-CM | POA: Diagnosis not present

## 2020-09-19 DIAGNOSIS — I10 Essential (primary) hypertension: Secondary | ICD-10-CM | POA: Diagnosis not present

## 2020-09-27 DIAGNOSIS — R2681 Unsteadiness on feet: Secondary | ICD-10-CM | POA: Diagnosis not present

## 2020-09-28 DIAGNOSIS — R2681 Unsteadiness on feet: Secondary | ICD-10-CM | POA: Diagnosis not present

## 2020-10-01 DIAGNOSIS — R2681 Unsteadiness on feet: Secondary | ICD-10-CM | POA: Diagnosis not present

## 2020-10-02 DIAGNOSIS — R2681 Unsteadiness on feet: Secondary | ICD-10-CM | POA: Diagnosis not present

## 2020-10-03 DIAGNOSIS — R2681 Unsteadiness on feet: Secondary | ICD-10-CM | POA: Diagnosis not present

## 2020-10-04 DIAGNOSIS — R2681 Unsteadiness on feet: Secondary | ICD-10-CM | POA: Diagnosis not present

## 2020-10-06 DIAGNOSIS — R2681 Unsteadiness on feet: Secondary | ICD-10-CM | POA: Diagnosis not present

## 2020-10-08 DIAGNOSIS — R2681 Unsteadiness on feet: Secondary | ICD-10-CM | POA: Diagnosis not present

## 2020-10-09 DIAGNOSIS — R2681 Unsteadiness on feet: Secondary | ICD-10-CM | POA: Diagnosis not present

## 2020-10-10 DIAGNOSIS — R2681 Unsteadiness on feet: Secondary | ICD-10-CM | POA: Diagnosis not present

## 2020-10-11 DIAGNOSIS — R2681 Unsteadiness on feet: Secondary | ICD-10-CM | POA: Diagnosis not present

## 2020-10-12 DIAGNOSIS — R2681 Unsteadiness on feet: Secondary | ICD-10-CM | POA: Diagnosis not present

## 2020-10-13 DIAGNOSIS — R2681 Unsteadiness on feet: Secondary | ICD-10-CM | POA: Diagnosis not present

## 2020-10-16 DIAGNOSIS — R2681 Unsteadiness on feet: Secondary | ICD-10-CM | POA: Diagnosis not present

## 2020-10-17 DIAGNOSIS — R2681 Unsteadiness on feet: Secondary | ICD-10-CM | POA: Diagnosis not present

## 2020-10-18 DIAGNOSIS — R2681 Unsteadiness on feet: Secondary | ICD-10-CM | POA: Diagnosis not present

## 2020-10-19 DIAGNOSIS — R2681 Unsteadiness on feet: Secondary | ICD-10-CM | POA: Diagnosis not present

## 2020-10-20 DIAGNOSIS — R2681 Unsteadiness on feet: Secondary | ICD-10-CM | POA: Diagnosis not present

## 2020-10-21 DIAGNOSIS — R2681 Unsteadiness on feet: Secondary | ICD-10-CM | POA: Diagnosis not present

## 2020-10-24 DIAGNOSIS — R2681 Unsteadiness on feet: Secondary | ICD-10-CM | POA: Diagnosis not present

## 2020-11-06 DIAGNOSIS — D519 Vitamin B12 deficiency anemia, unspecified: Secondary | ICD-10-CM | POA: Diagnosis not present

## 2020-11-06 DIAGNOSIS — E039 Hypothyroidism, unspecified: Secondary | ICD-10-CM | POA: Diagnosis not present

## 2020-11-22 DIAGNOSIS — Z79899 Other long term (current) drug therapy: Secondary | ICD-10-CM | POA: Diagnosis not present

## 2020-11-27 DIAGNOSIS — R1312 Dysphagia, oropharyngeal phase: Secondary | ICD-10-CM | POA: Diagnosis not present

## 2020-11-27 DIAGNOSIS — D509 Iron deficiency anemia, unspecified: Secondary | ICD-10-CM | POA: Diagnosis not present

## 2020-11-27 DIAGNOSIS — E039 Hypothyroidism, unspecified: Secondary | ICD-10-CM | POA: Diagnosis not present

## 2020-11-27 DIAGNOSIS — I10 Essential (primary) hypertension: Secondary | ICD-10-CM | POA: Diagnosis not present

## 2021-01-26 DIAGNOSIS — E039 Hypothyroidism, unspecified: Secondary | ICD-10-CM | POA: Diagnosis not present

## 2021-01-26 DIAGNOSIS — K5904 Chronic idiopathic constipation: Secondary | ICD-10-CM | POA: Diagnosis not present

## 2021-01-26 DIAGNOSIS — D509 Iron deficiency anemia, unspecified: Secondary | ICD-10-CM | POA: Diagnosis not present

## 2021-01-26 DIAGNOSIS — R1312 Dysphagia, oropharyngeal phase: Secondary | ICD-10-CM | POA: Diagnosis not present

## 2021-03-08 DIAGNOSIS — E039 Hypothyroidism, unspecified: Secondary | ICD-10-CM | POA: Diagnosis not present

## 2021-03-19 DIAGNOSIS — R1312 Dysphagia, oropharyngeal phase: Secondary | ICD-10-CM | POA: Diagnosis not present

## 2021-03-20 DIAGNOSIS — R1312 Dysphagia, oropharyngeal phase: Secondary | ICD-10-CM | POA: Diagnosis not present

## 2021-03-21 DIAGNOSIS — R1312 Dysphagia, oropharyngeal phase: Secondary | ICD-10-CM | POA: Diagnosis not present

## 2021-03-22 DIAGNOSIS — E876 Hypokalemia: Secondary | ICD-10-CM | POA: Diagnosis not present

## 2021-03-22 DIAGNOSIS — R1312 Dysphagia, oropharyngeal phase: Secondary | ICD-10-CM | POA: Diagnosis not present

## 2021-03-22 DIAGNOSIS — Z79899 Other long term (current) drug therapy: Secondary | ICD-10-CM | POA: Diagnosis not present

## 2021-03-23 DIAGNOSIS — R1312 Dysphagia, oropharyngeal phase: Secondary | ICD-10-CM | POA: Diagnosis not present

## 2021-03-27 DIAGNOSIS — R1312 Dysphagia, oropharyngeal phase: Secondary | ICD-10-CM | POA: Diagnosis not present

## 2021-03-28 DIAGNOSIS — R1312 Dysphagia, oropharyngeal phase: Secondary | ICD-10-CM | POA: Diagnosis not present

## 2021-03-29 DIAGNOSIS — R1312 Dysphagia, oropharyngeal phase: Secondary | ICD-10-CM | POA: Diagnosis not present

## 2021-03-30 DIAGNOSIS — R1312 Dysphagia, oropharyngeal phase: Secondary | ICD-10-CM | POA: Diagnosis not present

## 2021-03-31 DIAGNOSIS — E876 Hypokalemia: Secondary | ICD-10-CM | POA: Diagnosis not present

## 2021-04-02 DIAGNOSIS — R1312 Dysphagia, oropharyngeal phase: Secondary | ICD-10-CM | POA: Diagnosis not present

## 2021-04-03 DIAGNOSIS — R1312 Dysphagia, oropharyngeal phase: Secondary | ICD-10-CM | POA: Diagnosis not present

## 2021-04-04 DIAGNOSIS — R1312 Dysphagia, oropharyngeal phase: Secondary | ICD-10-CM | POA: Diagnosis not present

## 2021-04-05 DIAGNOSIS — R1312 Dysphagia, oropharyngeal phase: Secondary | ICD-10-CM | POA: Diagnosis not present

## 2021-04-06 DIAGNOSIS — R1312 Dysphagia, oropharyngeal phase: Secondary | ICD-10-CM | POA: Diagnosis not present

## 2021-04-19 DIAGNOSIS — E039 Hypothyroidism, unspecified: Secondary | ICD-10-CM | POA: Diagnosis not present

## 2021-05-14 DIAGNOSIS — D509 Iron deficiency anemia, unspecified: Secondary | ICD-10-CM | POA: Diagnosis not present

## 2021-05-14 DIAGNOSIS — I10 Essential (primary) hypertension: Secondary | ICD-10-CM | POA: Diagnosis not present

## 2021-05-14 DIAGNOSIS — K5900 Constipation, unspecified: Secondary | ICD-10-CM | POA: Diagnosis not present

## 2021-05-24 DIAGNOSIS — R7989 Other specified abnormal findings of blood chemistry: Secondary | ICD-10-CM | POA: Diagnosis not present

## 2021-06-11 DIAGNOSIS — H43823 Vitreomacular adhesion, bilateral: Secondary | ICD-10-CM | POA: Diagnosis not present

## 2021-06-12 DIAGNOSIS — K5900 Constipation, unspecified: Secondary | ICD-10-CM | POA: Diagnosis not present

## 2021-06-12 DIAGNOSIS — R1310 Dysphagia, unspecified: Secondary | ICD-10-CM | POA: Diagnosis not present

## 2021-06-12 DIAGNOSIS — D509 Iron deficiency anemia, unspecified: Secondary | ICD-10-CM | POA: Diagnosis not present

## 2021-06-12 DIAGNOSIS — H409 Unspecified glaucoma: Secondary | ICD-10-CM | POA: Diagnosis not present

## 2021-06-12 DIAGNOSIS — I10 Essential (primary) hypertension: Secondary | ICD-10-CM | POA: Diagnosis not present

## 2021-06-12 DIAGNOSIS — E538 Deficiency of other specified B group vitamins: Secondary | ICD-10-CM | POA: Diagnosis not present

## 2021-06-12 DIAGNOSIS — E039 Hypothyroidism, unspecified: Secondary | ICD-10-CM | POA: Diagnosis not present

## 2021-06-26 DIAGNOSIS — E538 Deficiency of other specified B group vitamins: Secondary | ICD-10-CM | POA: Diagnosis not present

## 2021-06-26 DIAGNOSIS — I10 Essential (primary) hypertension: Secondary | ICD-10-CM | POA: Diagnosis not present

## 2021-06-26 DIAGNOSIS — E039 Hypothyroidism, unspecified: Secondary | ICD-10-CM | POA: Diagnosis not present

## 2021-08-08 DIAGNOSIS — R1310 Dysphagia, unspecified: Secondary | ICD-10-CM | POA: Diagnosis not present

## 2021-08-08 DIAGNOSIS — I10 Essential (primary) hypertension: Secondary | ICD-10-CM | POA: Diagnosis not present

## 2021-08-08 DIAGNOSIS — K5900 Constipation, unspecified: Secondary | ICD-10-CM | POA: Diagnosis not present

## 2021-08-21 DIAGNOSIS — R278 Other lack of coordination: Secondary | ICD-10-CM | POA: Diagnosis not present

## 2021-08-21 DIAGNOSIS — M6281 Muscle weakness (generalized): Secondary | ICD-10-CM | POA: Diagnosis not present

## 2021-08-21 DIAGNOSIS — R2681 Unsteadiness on feet: Secondary | ICD-10-CM | POA: Diagnosis not present

## 2021-08-24 DIAGNOSIS — I10 Essential (primary) hypertension: Secondary | ICD-10-CM | POA: Diagnosis not present

## 2021-08-24 DIAGNOSIS — M6281 Muscle weakness (generalized): Secondary | ICD-10-CM | POA: Diagnosis not present

## 2021-08-24 DIAGNOSIS — D509 Iron deficiency anemia, unspecified: Secondary | ICD-10-CM | POA: Diagnosis not present

## 2021-08-24 DIAGNOSIS — E039 Hypothyroidism, unspecified: Secondary | ICD-10-CM | POA: Diagnosis not present

## 2021-08-24 DIAGNOSIS — R1310 Dysphagia, unspecified: Secondary | ICD-10-CM | POA: Diagnosis not present

## 2021-09-24 DIAGNOSIS — E039 Hypothyroidism, unspecified: Secondary | ICD-10-CM | POA: Diagnosis not present

## 2021-09-24 DIAGNOSIS — R1310 Dysphagia, unspecified: Secondary | ICD-10-CM | POA: Diagnosis not present

## 2021-09-24 DIAGNOSIS — D509 Iron deficiency anemia, unspecified: Secondary | ICD-10-CM | POA: Diagnosis not present

## 2021-10-20 DIAGNOSIS — I10 Essential (primary) hypertension: Secondary | ICD-10-CM | POA: Diagnosis not present

## 2021-10-20 DIAGNOSIS — D509 Iron deficiency anemia, unspecified: Secondary | ICD-10-CM | POA: Diagnosis not present

## 2021-10-20 DIAGNOSIS — R7989 Other specified abnormal findings of blood chemistry: Secondary | ICD-10-CM | POA: Diagnosis not present

## 2021-10-20 DIAGNOSIS — Z79899 Other long term (current) drug therapy: Secondary | ICD-10-CM | POA: Diagnosis not present

## 2021-10-25 DIAGNOSIS — K5900 Constipation, unspecified: Secondary | ICD-10-CM | POA: Diagnosis not present

## 2021-10-25 DIAGNOSIS — R1312 Dysphagia, oropharyngeal phase: Secondary | ICD-10-CM | POA: Diagnosis not present

## 2021-10-25 DIAGNOSIS — R7401 Elevation of levels of liver transaminase levels: Secondary | ICD-10-CM | POA: Diagnosis not present

## 2021-10-25 DIAGNOSIS — D649 Anemia, unspecified: Secondary | ICD-10-CM | POA: Diagnosis not present

## 2021-10-25 DIAGNOSIS — D509 Iron deficiency anemia, unspecified: Secondary | ICD-10-CM | POA: Diagnosis not present

## 2021-10-25 DIAGNOSIS — H409 Unspecified glaucoma: Secondary | ICD-10-CM | POA: Diagnosis not present

## 2021-10-25 DIAGNOSIS — E039 Hypothyroidism, unspecified: Secondary | ICD-10-CM | POA: Diagnosis not present

## 2021-10-26 DIAGNOSIS — E039 Hypothyroidism, unspecified: Secondary | ICD-10-CM | POA: Diagnosis not present
